# Patient Record
Sex: Female | Born: 1965 | Race: Black or African American | Hispanic: No | Marital: Married | Smoking: Former smoker
Health system: Southern US, Community
[De-identification: ages and names within clinical notes are randomized; demographics above are authoritative.]

## PROBLEM LIST (undated history)

## (undated) DIAGNOSIS — K5909 Other constipation: Secondary | ICD-10-CM

## (undated) DIAGNOSIS — N951 Menopausal and female climacteric states: Secondary | ICD-10-CM

## (undated) DIAGNOSIS — K219 Gastro-esophageal reflux disease without esophagitis: Secondary | ICD-10-CM

## (undated) DIAGNOSIS — R7989 Other specified abnormal findings of blood chemistry: Secondary | ICD-10-CM

## (undated) DIAGNOSIS — R5382 Chronic fatigue, unspecified: Secondary | ICD-10-CM

## (undated) HISTORY — DX: Chronic fatigue, unspecified: R53.82

## (undated) HISTORY — DX: Menopausal and female climacteric states: N95.1

## (undated) HISTORY — DX: Other specified abnormal findings of blood chemistry: R79.89

## (undated) HISTORY — DX: Other constipation: K59.09

## (undated) HISTORY — PX: TUBAL LIGATION: SHX77

---

## 2004-08-02 ENCOUNTER — Emergency Department: Payer: Self-pay | Admitting: Emergency Medicine

## 2005-07-09 ENCOUNTER — Ambulatory Visit: Payer: Self-pay | Admitting: General Practice

## 2005-09-03 ENCOUNTER — Ambulatory Visit: Payer: Self-pay | Admitting: Internal Medicine

## 2011-07-06 ENCOUNTER — Emergency Department: Payer: Self-pay | Admitting: Emergency Medicine

## 2014-09-13 ENCOUNTER — Ambulatory Visit (INDEPENDENT_AMBULATORY_CARE_PROVIDER_SITE_OTHER): Payer: BLUE CROSS/BLUE SHIELD | Admitting: Family Medicine

## 2014-09-13 ENCOUNTER — Encounter: Payer: Self-pay | Admitting: Family Medicine

## 2014-09-13 VITALS — BP 118/70 | HR 87 | Temp 98.1°F | Resp 16 | Ht 63.0 in | Wt 195.4 lb

## 2014-09-13 DIAGNOSIS — D8989 Other specified disorders involving the immune mechanism, not elsewhere classified: Secondary | ICD-10-CM | POA: Insufficient documentation

## 2014-09-13 DIAGNOSIS — R7989 Other specified abnormal findings of blood chemistry: Secondary | ICD-10-CM | POA: Diagnosis not present

## 2014-09-13 DIAGNOSIS — M25562 Pain in left knee: Secondary | ICD-10-CM

## 2014-09-13 DIAGNOSIS — Z13220 Encounter for screening for lipoid disorders: Secondary | ICD-10-CM | POA: Insufficient documentation

## 2014-09-13 DIAGNOSIS — K5900 Constipation, unspecified: Secondary | ICD-10-CM | POA: Diagnosis not present

## 2014-09-13 DIAGNOSIS — Z1239 Encounter for other screening for malignant neoplasm of breast: Secondary | ICD-10-CM | POA: Insufficient documentation

## 2014-09-13 DIAGNOSIS — E669 Obesity, unspecified: Secondary | ICD-10-CM | POA: Diagnosis not present

## 2014-09-13 DIAGNOSIS — Z131 Encounter for screening for diabetes mellitus: Secondary | ICD-10-CM | POA: Insufficient documentation

## 2014-09-13 DIAGNOSIS — K5909 Other constipation: Secondary | ICD-10-CM | POA: Insufficient documentation

## 2014-09-13 DIAGNOSIS — O039 Complete or unspecified spontaneous abortion without complication: Secondary | ICD-10-CM | POA: Insufficient documentation

## 2014-09-13 DIAGNOSIS — E7800 Pure hypercholesterolemia, unspecified: Secondary | ICD-10-CM | POA: Insufficient documentation

## 2014-09-13 DIAGNOSIS — Z0000 Encounter for general adult medical examination without abnormal findings: Secondary | ICD-10-CM | POA: Diagnosis not present

## 2014-09-13 DIAGNOSIS — G9332 Myalgic encephalomyelitis/chronic fatigue syndrome: Secondary | ICD-10-CM | POA: Insufficient documentation

## 2014-09-13 DIAGNOSIS — Z124 Encounter for screening for malignant neoplasm of cervix: Secondary | ICD-10-CM

## 2014-09-13 DIAGNOSIS — Z1231 Encounter for screening mammogram for malignant neoplasm of breast: Secondary | ICD-10-CM | POA: Diagnosis not present

## 2014-09-13 DIAGNOSIS — N951 Menopausal and female climacteric states: Secondary | ICD-10-CM | POA: Insufficient documentation

## 2014-09-13 DIAGNOSIS — R5382 Chronic fatigue, unspecified: Secondary | ICD-10-CM

## 2014-09-13 MED ORDER — MELOXICAM 15 MG PO TABS: 15.0000 mg | ORAL_TABLET | Freq: Every day | ORAL | Status: DC

## 2014-09-13 NOTE — Patient Instructions (Signed)

## 2014-09-13 NOTE — Progress Notes (Signed)
Name: Kathleen Martinez   MRN: [id number]    DOB: 06/29/1965   Date:09/13/2014       Progress Note  Subjective  Chief Complaint  Chief Complaint  Patient presents with  . Annual Exam  . Knee Pain    patient states that she has some left knee pain at times that shoots upward to her hip and lower back. she did have an injury last year with a knot present.    HPI  Patient is here today for a Complete Female Physical Exam:  The patient has complaints of knee pain. Overall feels healthy. Diet is well balanced. In general does not exercise regularly. Sees dentist regularly and addresses vision concerns with ophthalmologist if applicable. In regards to sexual activity the patient is not currently sexually active. Currently is not concerned about exposure to any STDs. Menstrual history is absent for menstration past 3 years. Post menopausal with all female organs in tact. She is still smoking 1/2 PPD of cigarettes and is interested in quitting but having a hard time.  Joint/Muscle Pain: Patient complains of arthralgias for which has been present for a few months. Pain is located in the left knee(s), is described as aching and throbbing, and is intermittent .  Associated symptoms include: none.  The patient has tried nothing for pain relief.  Related to injury:   In the past has had direct trauma to knee without sequelae.  Constipation: Patient complains of constipation.  Stool pattern has been 4 formed stool(s) per week. Onset was several years ago Defecation has been difficult and incomplete. Co-Morbid conditions:obesity and sedentary lifestyle. Symptoms have been stable. Current Health Habits: Eating fiber? no Exercise?no Water intake? Inadequate. Current OTC/RX therapy has been colace  which has been somewhat effective.  Could not afford the senna-docusate Rx previously sent in.     Past Medical History  Diagnosis Date  . Chronic constipation   . Low serum vitamin D   . Menopausal state   . Chronic  fatigue     Past Surgical History  Procedure Laterality Date  . Tubal ligation      Family History  Problem Relation Age of Onset  . Diabetes Mother   . Hypertension Father   . Diabetes Sister   . Diabetes Brother     Social History   Social History  . Marital Status: Married    Spouse Name: N/A  . Number of Children: N/A  . Years of Education: N/A   Occupational History  . Not on file.   Social History Main Topics  . Smoking status: Current Every Day Smoker -- 0.50 packs/day for 21 years    Types: Cigarettes  . Smokeless tobacco: Not on file  . Alcohol Use: 0.0 oz/week    0 Standard drinks or equivalent per week     Comment: occasional  . Drug Use: No  . Sexual Activity: No   Other Topics Concern  . Not on file   Social History Narrative  . No narrative on file     Current outpatient prescriptions:  Marland Kitchen  Vitamin D, Ergocalciferol, (DRISDOL) 50000 UNITS CAPS capsule, Take by mouth., Disp: , Rfl:  .  senna-docusate (SENOKOT-S) 8.6-50 MG per tablet, Take by mouth., Disp: , Rfl:   No Known Allergies  ROS  CONSTITUTIONAL: No significant weight changes, fever, chills, weakness or fatigue.  HEENT:  - Eyes: No visual changes.  - Ears: No auditory changes. No pain.  - Nose: No sneezing, congestion, runny nose. -  Throat: No sore throat. No changes in swallowing. SKIN: No rash or itching.  CARDIOVASCULAR: No chest pain, chest pressure or chest discomfort. No palpitations or edema.  RESPIRATORY: No shortness of breath, cough or sputum.  GASTROINTESTINAL: No anorexia, nausea, vomiting. No changes in bowel habits. No abdominal pain or blood.  GENITOURINARY: No dysuria. No frequency. No discharge.  NEUROLOGICAL: No headache, dizziness, syncope, paralysis, ataxia, numbness or tingling in the extremities. No memory changes. No change in bowel or bladder control.  MUSCULOSKELETAL: Yes joint pain. No muscle pain. HEMATOLOGIC: No anemia, bleeding or bruising.   LYMPHATICS: No enlarged lymph nodes.  PSYCHIATRIC: No change in mood. No change in sleep pattern.  ENDOCRINOLOGIC: No reports of sweating, cold or heat intolerance. No polyuria or polydipsia.   Objective  Filed Vitals:   09/13/14 0851  BP: 118/70  Pulse: 87  Temp: 98.1 F (36.7 C)  TempSrc: Oral  Resp: 16  Height: 5\' 3"  (1.6 m)  Weight: 195 lb 6.4 oz (88.633 kg)  SpO2: 99%   Body mass index is 34.62 kg/(m^2).  Depression screen PHQ 2/9 09/13/2014  Decreased Interest 0  Down, Depressed, Hopeless 0  PHQ - 2 Score 0     Physical Exam  Constitutional: Patient is obese and well-nourished. In no distress.  HEENT:  - Head: Normocephalic and atraumatic.  - Ears: Bilateral TMs gray, no erythema or effusion - Nose: Nasal mucosa moist - Mouth/Throat: Oropharynx is clear and moist. No tonsillar hypertrophy or erythema. No post nasal drainage.  - Eyes: Conjunctivae clear, EOM movements normal. PERRLA. No scleral icterus.  Neck: Normal range of motion. Neck supple. No JVD present. No thyromegaly present.  Cardiovascular: Normal rate, regular rhythm and normal heart sounds.  No murmur heard.  Pulmonary/Chest: Effort normal and breath sounds normal. No respiratory distress. Abdominal: Soft. Bowel sounds are normal, no distension. There is no tenderness. no masses BREAST: Bilateral breast exam normal with no masses, skin changes or nipple discharge FEMALE GENITALIA:  External genitalia normal External urethra normal Vaginal vault normal without discharge or lesions Cervix normal without discharge or lesions Bimanual exam normal without masses RECTAL: no rectal masses or hemorrhoids Musculoskeletal: Normal range of motion bilateral UE and LE, no joint effusions. Peripheral vascular: Bilateral LE no edema.  Left knee healed scar from previous injury. No crepitus, no swelling, no warmth. Normal ROM.  Mild tenderness over patellar tendon. Neurological: CN II-XII grossly intact with no  focal deficits. Alert and oriented to person, place, and time. Coordination, balance, strength, speech and gait are normal.  Skin: Skin is warm and dry. No rash noted. No erythema.  Psychiatric: Patient has a normal mood and affect. Behavior is normal in office today. Judgment and thought content normal in office today.   Assessment & Plan  1. Annual physical exam She will get flu shot at her work as it is free of charge.  2. Obesity, Class I, BMI 30.0-34.9 (see actual BMI) The patient has been counseled on their higher than normal BMI.  They have verbally expressed understanding their increased risk for other diseases.  In efforts to meet a better target BMI goal the patient has been counseled on lifestyle, diet and exercise modification tactics. Start with moderate intensity aerobic exercise (walking, jogging, elliptical, swimming, group or individual sports, hiking) at least a day at least 4 days a week and increase intensity, duration, frequency as tolerated. Diet should include well balance fresh fruits and vegetables avoiding processed foods, carbohydrates and sugars. Drink at  least 8oz 10 glasses a day avoiding sodas, sugary fruit drinks, sweetened tea. Check weight on a reliable scale daily and monitor weight loss progress daily. Consider investing in mobile phone apps that will help keep track of weight loss goals.  - CBC with Differential/Platelet - Comprehensive metabolic panel - TSH  3. Elevated LDL cholesterol level  - Lipid panel - TSH  4. Low serum vitamin D Finished course of high dose Vit D last year.  - CBC with Differential/Platelet - Comprehensive metabolic panel - Vit D  25 hydroxy (rtn osteoporosis monitoring) - TSH  5. Chronic constipation Increase fiber in diet, more water, more exercise.  - CBC with Differential/Platelet - Comprehensive metabolic panel - TSH  6. Left knee pain Likely early arthritis. Instructed patient that if symptoms persist we  can get X-ray otherwise weight loss will help tremendously.   - meloxicam (MOBIC) 15 MG tablet; Take 1 tablet (15 mg total) by mouth daily.  Dispense: 30 tablet; Refill: 2  7. Encounter for screening mammogram for malignant neoplasm of breast  - MM Digital Screening; Future  8. Encounter for screening for malignant neoplasm of cervix  - Pap IG w/ reflex to HPV when [facility]

## 2014-09-15 LAB — PAP IG W/ RFLX HPV ASCU: PAP SMEAR COMMENT: 0

## 2014-12-24 ENCOUNTER — Emergency Department
Admission: EM | Admit: 2014-12-24 | Discharge: 2014-12-24 | Disposition: A | Discharge status: Home / Self Care | Payer: BLUE CROSS/BLUE SHIELD | Attending: Emergency Medicine | Admitting: Emergency Medicine

## 2014-12-24 ENCOUNTER — Encounter: Payer: Self-pay | Admitting: Emergency Medicine

## 2014-12-24 ENCOUNTER — Emergency Department: Payer: BLUE CROSS/BLUE SHIELD

## 2014-12-24 DIAGNOSIS — Y9389 Activity, other specified: Secondary | ICD-10-CM | POA: Insufficient documentation

## 2014-12-24 DIAGNOSIS — Y9289 Other specified places as the place of occurrence of the external cause: Secondary | ICD-10-CM | POA: Insufficient documentation

## 2014-12-24 DIAGNOSIS — Y990 Civilian activity done for income or pay: Secondary | ICD-10-CM | POA: Diagnosis not present

## 2014-12-24 DIAGNOSIS — Z791 Long term (current) use of non-steroidal anti-inflammatories (NSAID): Secondary | ICD-10-CM | POA: Insufficient documentation

## 2014-12-24 DIAGNOSIS — X58XXXA Exposure to other specified factors, initial encounter: Secondary | ICD-10-CM | POA: Insufficient documentation

## 2014-12-24 DIAGNOSIS — F17210 Nicotine dependence, cigarettes, uncomplicated: Secondary | ICD-10-CM | POA: Insufficient documentation

## 2014-12-24 DIAGNOSIS — S4991XA Unspecified injury of right shoulder and upper arm, initial encounter: Secondary | ICD-10-CM | POA: Diagnosis present

## 2014-12-24 DIAGNOSIS — M25511 Pain in right shoulder: Secondary | ICD-10-CM

## 2014-12-24 DIAGNOSIS — Z79899 Other long term (current) drug therapy: Secondary | ICD-10-CM | POA: Insufficient documentation

## 2014-12-24 DIAGNOSIS — S46911A Strain of unspecified muscle, fascia and tendon at shoulder and upper arm level, right arm, initial encounter: Secondary | ICD-10-CM | POA: Diagnosis not present

## 2014-12-24 MED ORDER — NAPROXEN 500 MG PO TABS: 500.0000 mg | ORAL_TABLET | Freq: Two times a day (BID) | ORAL | Status: DC

## 2014-12-24 NOTE — ED Notes (Signed)
States she developed pain to right shoulder after throwing some trash in the dumpster yesterday  Having pain to shoulder with movement ROM is limited d/t pain no deformity noted

## 2014-12-24 NOTE — ED Notes (Signed)
Patient is complaining of right shoulder pain.  Patient reports taking the trash out and throwing a bag into the trash can.  After this pain in her shoulder started.  Patient reports decreased range of motion in her right arm.

## 2014-12-24 NOTE — ED Provider Notes (Signed)
[facility] Emergency Department Provider Note  ____________________________________________  Time seen: Approximately 9:00 AM  I have reviewed the triage vital signs and the nursing notes.   HISTORY  Chief Complaint Shoulder Pain  HPI Kathleen Martinez is a 49 y.o. female is here with complaint of right shoulder pain after throwing some trash in a dumpster yesterday. Patient states this occurred at work and she is unable to estimate how much the trash weight when she did this. She states that she is having difficulty with range of motion of her right shoulder secondary to pain. She is not taking any over-the-counter medication for this. She denies any previous injury to her right shoulder. There is no paresthesias to her right arm.Currently she rates her pain as 5 out of 10. Pain is constant and unrelieved with rest.   Past Medical History  Diagnosis Date  . Chronic constipation   . Low serum vitamin D   . Menopausal state   . Chronic fatigue     Patient Active Problem List   Diagnosis Date Noted  . Chronic constipation 09/13/2014  . Low serum vitamin D 09/13/2014  . Obesity, Class I, BMI 30.0-34.9 (see actual BMI) 09/13/2014  . Elevated LDL cholesterol level 09/13/2014  . Annual physical exam 09/13/2014  . Left knee pain 09/13/2014  . Encounter for screening mammogram for malignant neoplasm of breast 09/13/2014  . Encounter for screening for malignant neoplasm of cervix 09/13/2014    Past Surgical History  Procedure Laterality Date  . Tubal ligation      Current Outpatient Rx  Name  Route  Sig  Dispense  Refill  . meloxicam (MOBIC) 15 MG tablet   Oral   Take 1 tablet (15 mg total) by mouth daily.   30 tablet   2   . naproxen (NAPROSYN) 500 MG tablet   Oral   Take 1 tablet (500 mg total) by mouth 2 (two) times daily with a meal.   30 tablet   0   . senna-docusate (SENOKOT-S) 8.6-50 MG per tablet   Oral   Take by mouth.         . Vitamin  D, Ergocalciferol, (DRISDOL) 50000 UNITS CAPS capsule   Oral   Take by mouth.           Allergies Review of patient's allergies indicates no known allergies.  Family History  Problem Relation Age of Onset  . Diabetes Mother   . Hypertension Father   . Diabetes Sister   . Diabetes Brother     Social History Social History  Substance Use Topics  . Smoking status: Current Every Day Smoker -- 0.50 packs/day for 21 years    Types: Cigarettes  . Smokeless tobacco: None  . Alcohol Use: 0.0 oz/week    0 Standard drinks or equivalent per week     Comment: occasional    Review of Systems Constitutional: No fever/chills Eyes: No visual changes. ENT: No trauma   Cardiovascular: Denies chest pain. Respiratory: Denies shortness of breath. Gastrointestinal:  No nausea, no vomiting.  Musculoskeletal: Negative for back pain. Positive right shoulder pain. Skin: Negative for rash. Neurological: Negative for headaches, focal weakness or numbness.  10-point ROS otherwise negative.  ____________________________________________   PHYSICAL EXAM:  VITAL SIGNS: ED Triage Vitals  Enc Vitals Group     BP 12/24/14 0828 127/76 mmHg     Pulse Rate 12/24/14 0828 71     Resp 12/24/14 0828 18     Temp 12/24/14  0828 98.2 F (36.8 C)     Temp Source 12/24/14 0828 Oral     SpO2 12/24/14 0828 96 %     Weight 12/24/14 0828 165 lb (74.844 kg)     Height 12/24/14 0828 5\' 3"  (1.6 m)     Head Cir --      Peak Flow --      Pain Score 12/24/14 0828 5     Pain Loc --      Pain Edu? --      Excl. in GC? --     Constitutional: Alert and oriented. Well appearing and in no acute distress. Eyes: Conjunctivae are normal. PERRL. EOMI. Head: Atraumatic. Nose: No congestion/rhinnorhea. Neck: No stridor.  No cervical tenderness on palpation. Cardiovascular: Normal rate, regular rhythm. Grossly normal heart sounds.  Good peripheral circulation. Respiratory: Normal respiratory effort.  No  retractions. Lungs CTAB. Musculoskeletal: Right shoulder exam no gross deformity was noted. There is moderate tenderness on palpation of the distal portion of the clavicle and at the of acromioclavicular joint. Range of motion is restricted secondary to discomfort. No crepitus was noted. Grip strength distal is within normal limits. Neurologic:  Normal speech and language. No gross focal neurologic deficits are appreciated. No gait instability. Skin:  Skin is warm, dry and intact. No rash noted. Psychiatric: Mood and affect are normal. Speech and behavior are normal.  ____________________________________________   LABS (all labs ordered are listed, but only abnormal results are displayed)  Labs Reviewed - No data to display   RADIOLOGY  Right shoulder x-ray per radiologist shows slight narrowing of this acromial space and calcification and hypertrophic spur at the insertion of the supraspinatus on the greater tuberosity of the humeral head. I, Tommi Rumpshonda L Prince Couey, personally viewed and evaluated these images (plain radiographs) as part of my medical decision making.  ____________________________________________   PROCEDURES  Procedure(s) performed: None  Critical Care performed: No  ____________________________________________   INITIAL IMPRESSION / ASSESSMENT AND PLAN / ED COURSE  Pertinent labs & imaging results that were available during my care of the patient were reviewed by me and considered in my medical decision making (see chart for details).  Patient was placed on naproxen twice a day with food and follow-up with Dr. Hyacinth MeekerMiller if any continued problems. Patient was given a note to take to work with restrictions in regards to her right shoulder. ____________________________________________   FINAL CLINICAL IMPRESSION(S) / ED DIAGNOSES  Final diagnoses:  Shoulder pain, acute, right  Muscle strain, shoulder region, right, initial encounter      Tommi RumpsRhonda L Janyia Guion,  PA-C 12/24/14 1143  Sharyn CreamerMark Quale, MD 12/24/14 [phone]

## 2015-01-26 ENCOUNTER — Ambulatory Visit: Payer: BLUE CROSS/BLUE SHIELD | Admitting: Family Medicine

## 2015-02-01 ENCOUNTER — Ambulatory Visit (INDEPENDENT_AMBULATORY_CARE_PROVIDER_SITE_OTHER): Payer: BLUE CROSS/BLUE SHIELD | Admitting: Family Medicine

## 2015-02-01 ENCOUNTER — Encounter: Payer: Self-pay | Admitting: Family Medicine

## 2015-02-01 VITALS — BP 120/80 | HR 110 | Temp 98.1°F | Resp 16 | Ht 63.0 in | Wt 200.0 lb

## 2015-02-01 DIAGNOSIS — J209 Acute bronchitis, unspecified: Secondary | ICD-10-CM | POA: Insufficient documentation

## 2015-02-01 MED ORDER — AMOXICILLIN-POT CLAVULANATE 875-125 MG PO TABS: 1.0000 | ORAL_TABLET | Freq: Two times a day (BID) | ORAL | Status: DC

## 2015-02-01 MED ORDER — HYDROCOD POLST-CPM POLST ER 10-8 MG/5ML PO SUER: 5.0000 mL | Freq: Two times a day (BID) | ORAL | Status: DC | PRN

## 2015-02-01 MED ORDER — BENZONATATE 200 MG PO CAPS: 200.0000 mg | ORAL_CAPSULE | Freq: Three times a day (TID) | ORAL | Status: DC | PRN

## 2015-02-01 NOTE — Patient Instructions (Signed)

## 2015-02-01 NOTE — Progress Notes (Signed)
Name: Kathleen Martinez   MRN: [id number]    DOB: November 20, 1965   Date:02/01/2015       Progress Note  Subjective  Chief Complaint  Chief Complaint  Patient presents with  . URI    HPI  Patient is here today with concerns regarding the following symptoms congestion, sneezing, sinus pressure and productive cough that started 3 weeks ago.  Associated with chills, sweats, fatigue and malaise. Not associated with fever. Has tried the following home remedies: Robatussin.    Past Medical History  Diagnosis Date  . Chronic constipation   . Low serum vitamin D   . Menopausal state   . Chronic fatigue     Social History  Substance Use Topics  . Smoking status: Current Every Day Smoker -- 0.50 packs/day for 21 years    Types: Cigarettes  . Smokeless tobacco: Not on file  . Alcohol Use: 0.0 oz/week    0 Standard drinks or equivalent per week     Comment: occasional     Current outpatient prescriptions:  Marland Kitchen  Vitamin D, Ergocalciferol, (DRISDOL) 50000 UNITS CAPS capsule, Take by mouth., Disp: , Rfl:   No Known Allergies  ROS  Positive for fatigue, nasal congestion, sinus pressure, ear fullness, cough as mentioned in HPI, otherwise all systems reviewed and are negative.  Objective  Filed Vitals:   02/01/15 1417  BP: 120/80  Pulse: 110  Temp: 98.1 F (36.7 C)  TempSrc: Oral  Resp: 16  Height:  (1.6 m)  Weight: 200 lb (90.719 kg)  SpO2: 98%   Body mass index is 35.44 kg/(m^2).   Physical Exam  Constitutional: Patient appears well-developed and well-nourished. In no acute distress but does appear to be fatigued from acute illness. HEENT:  - Head: Normocephalic and atraumatic.  - Ears: RIGHT TM bulging with mild erythema and minimal clear exudate, LEFT TM bulging with minimal clear exudate.  - Nose: Nasal mucosa boggy and congested.  - Mouth/Throat: Oropharynx is moist with slight erythema of bilateral tonsils without hypertrophy or exudates. Post nasal drainage present.   - Eyes: Conjunctivae clear, EOM movements normal. PERRLA. No scleral icterus.  Neck: Normal range of motion. Neck supple. No JVD present. No thyromegaly present. No local lymphadenopathy. Cardiovascular: Regular rate, regular rhythm with no murmurs heard.  Pulmonary/Chest: Effort normal and breath sounds clear with right midline lung rhonchi. Musculoskeletal: Normal range of motion bilateral UE and LE, no joint effusions. Skin: Skin is warm and dry. No rash noted. Psychiatric: Patient has a normal mood and affect. Behavior is normal in office today. Judgment and thought content normal in office today.   Assessment & Plan  1. Bronchitis, acute, with bronchospasm  Etiologies include initial allergic rhinitis or viral infection progressing to superimposed bacterial infection. Instructed patient on increasing hydration, nasal saline spray, steam inhalation, NSAID if tolerated and not contraindicated. Due to prolonged duration of symptoms start antibiotic therapy.  - amoxicillin-clavulanate (AUGMENTIN) 875-125 MG tablet; Take 1 tablet by mouth 2 (two) times daily.  Dispense: 20 tablet; Refill: 0 - chlorpheniramine-HYDROcodone (TUSSIONEX PENNKINETIC ER) 10-8 MG/5ML SUER; Take 5 mLs by mouth every 12 (twelve) hours as needed.  Dispense: 115 mL; Refill: 0 - benzonatate (TESSALON) 200 MG capsule; Take 1 capsule (200 mg total) by mouth 3 (three) times daily as needed for cough.  Dispense: 30 capsule; Refill: 0

## 2015-02-09 DIAGNOSIS — M7520 Bicipital tendinitis, unspecified shoulder: Secondary | ICD-10-CM | POA: Insufficient documentation

## 2015-02-09 DIAGNOSIS — M7550 Bursitis of unspecified shoulder: Secondary | ICD-10-CM | POA: Insufficient documentation

## 2015-04-25 ENCOUNTER — Encounter: Payer: Self-pay | Admitting: Emergency Medicine

## 2015-04-25 ENCOUNTER — Emergency Department
Admission: EM | Admit: 2015-04-25 | Discharge: 2015-04-25 | Disposition: A | Discharge status: Home / Self Care | Payer: BLUE CROSS/BLUE SHIELD | Attending: Emergency Medicine | Admitting: Emergency Medicine

## 2015-04-25 DIAGNOSIS — J209 Acute bronchitis, unspecified: Secondary | ICD-10-CM | POA: Insufficient documentation

## 2015-04-25 DIAGNOSIS — E669 Obesity, unspecified: Secondary | ICD-10-CM | POA: Insufficient documentation

## 2015-04-25 DIAGNOSIS — Z6830 Body mass index (BMI) 30.0-30.9, adult: Secondary | ICD-10-CM | POA: Diagnosis not present

## 2015-04-25 DIAGNOSIS — E785 Hyperlipidemia, unspecified: Secondary | ICD-10-CM | POA: Insufficient documentation

## 2015-04-25 DIAGNOSIS — F17210 Nicotine dependence, cigarettes, uncomplicated: Secondary | ICD-10-CM | POA: Insufficient documentation

## 2015-04-25 DIAGNOSIS — T5994XA Toxic effect of unspecified gases, fumes and vapors, undetermined, initial encounter: Secondary | ICD-10-CM | POA: Insufficient documentation

## 2015-04-25 DIAGNOSIS — K5904 Chronic idiopathic constipation: Secondary | ICD-10-CM | POA: Insufficient documentation

## 2015-04-25 DIAGNOSIS — J689 Unspecified respiratory condition due to chemicals, gases, fumes and vapors: Secondary | ICD-10-CM

## 2015-04-25 DIAGNOSIS — T59811A Toxic effect of smoke, accidental (unintentional), initial encounter: Secondary | ICD-10-CM

## 2015-04-25 NOTE — Discharge Instructions (Signed)
Chemical Inhalation Injury A chemical inhalation injury is an internal injury, such as lung damage, that results from breathing in fumes of a chemical or harmful substance (toxic agent). Chemical inhalation injuries most often occur:  During fires, when materials that are burned release chemicals into the environment.  During work accidents, when large quantities of toxic chemicals are spilled at [employer] or industrial sites. Chemical inhalation injuries vary in severity. An injury tends to be more severe:  The more acidic or alkaline the chemical is.  The more concentrated the substance is.  The longer you are exposed to the substance. RISK FACTORS You are at a high risk for a chemical inhalation injury if you:  Are exposed to burning materials.  Work with chemicals, solvents, or cleaners. SIGNS AND SYMPTOMS Symptoms of a chemical inhalation injury may include:  Hoarse voice.  Shortness of breath or trouble breathing.  Chest pain.  Pale or blue skin.  Mucus production.  Cough.  Weakness.  Dizziness or fainting. DIAGNOSIS Most chemical inhalation injuries can be diagnosed with a physical exam and medical history. Tests may be done to check for lung damage. They may include:  A blood oxygen level test.  A chest X-ray.  Pulmonary function tests. There are no tests to identify the specific chemical or substance that caused the injury. TREATMENT  There is no specific treatment for a chemical inhalation injury. Most treatment is directed at improving the ability of the lungs to deliver oxygen to the body. Time is needed for lung tissue to heal. Supportive treatment may include:  Aerosol treatments to decrease swelling in the airways.  Suctioning of the airways to remove excess mucus.  Supplemental oxygen. HOME CARE INSTRUCTIONS  Do not use any tobacco products, including cigarettes, chewing tobacco, or electronic cigarettes. If you need help quitting, ask your  health care provider.  Do not allow yourself to be exposed to any airway irritants, such as cigarette smoke or smoke from a fireplace.  Follow your health care provider's instructions for the use of any inhalers.  Take medicines only as directed by your health care provider.  Keep all follow-up visits as directed by your health care provider. This is important. SEEK MEDICAL CARE IF:  Your symptoms are not improving as your health care provider predicted. SEEK IMMEDIATE MEDICAL CARE IF:  Your symptoms get worse.  You have increasing shortness of breath or wheezing.  Your skin or your lips appear very pale or blue.  You have a persistent cough.  You cough up blood or dark material.  You have chest pain or weakness.  You have a fever.  You faint.   This information is not intended to replace advice given to you by your health care provider. Make sure you discuss any questions you have with your health care provider.   Document Released: 09/03/2013 Document Reviewed: 09/03/2013 Elsevier Interactive Patient Education 2016 [employer].  Smoke Inhalation, Mild Smoke inhalation means that you have breathed in smoke. Exposure to hot smoke from a fire can damage all parts of your airway including your nose, mouth, throat (trachea), and lungs. If you received a burn injury on the outside of your body from a fire, you are also at risk of having a smoke inhalation injury in your airways. SIGNS AND SYMPTOMS The symptoms of smoke inhalation injury are often delayed for up to a day after exposure and usually improve quickly. Symptoms may include:  Sore throat.  Cough, including coughing up black material that looks  burnt (carbonaceous sputum).  Wheezing or abnormal noises when you inhale (stridor).  Chest pain.  Trouble breathing. RISK FACTORS Patients with chronic lung disease or a history of alcohol abuse are at higher risk for serious complications from smoke  inhalation. DIAGNOSIS Your health care provider may suspect smoke inhalation injury based on the history of exposure, symptoms, and physical findings. Your health care provider may perform other tests such as:  Chest X-ray exams or CT scans.  Inspection of your airway (laryngoscopy or bronchoscopy).  Blood tests. Further medical evaluation and hospital care may be needed if your symptoms get worse over the next 1-2 days. TREATMENT If you have breathing difficulty from the smoke inhalation, you may be admitted to the hospital for overnight observation. If severe breathing trouble develops, a breathing tube may be needed to help you breathe.You also may be treated with supplemental oxygen therapy. HOME CARE INSTRUCTIONS  Do not return to the area of the fire until the proper authorities tell you it is safe.  Do not smoke.  Do not drink alcohol until approved by your health care provider.  Drink enough water and fluids to keep your urine clear or pale yellow.  Get plenty of rest for the next 2-3 days.  Only take over-the-counter or prescription medicines for pain, fever, or discomfort as directed by your health care provider.  Follow up with your health care provider as directed. SEEK IMMEDIATE MEDICAL CARE IF:   You have wheezing, difficulty breathing, a continuous cough, or increased spit.  You have severe chest pain or headache.  You have nausea or vomiting.  You have shortness of breath with your usual activities. Your heart seems to beat too fast with minimal exercise.  You become confused, irritable, or unusually sleepy.  You experience dizziness.  You develop any breathing problems that are worsening rather than improving.   This information is not intended to replace advice given to you by your health care provider. Make sure you discuss any questions you have with your health care provider.   Document Released: 12/29/1999 Document Revised: 10/21/2012 Document  Reviewed: 08/04/2012 Elsevier Interactive Patient Education [employer]2016 [employer] Inc[employer]
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
## 2015-09-15 ENCOUNTER — Encounter: Payer: BLUE CROSS/BLUE SHIELD | Admitting: Family Medicine

## 2016-09-02 ENCOUNTER — Emergency Department: Payer: BLUE CROSS/BLUE SHIELD

## 2016-09-02 ENCOUNTER — Emergency Department
Admission: EM | Admit: 2016-09-02 | Discharge: 2016-09-02 | Disposition: A | Discharge status: Home / Self Care | Payer: BLUE CROSS/BLUE SHIELD | Attending: Emergency Medicine | Admitting: Emergency Medicine

## 2016-09-02 DIAGNOSIS — F17210 Nicotine dependence, cigarettes, uncomplicated: Secondary | ICD-10-CM | POA: Diagnosis not present

## 2016-09-02 DIAGNOSIS — R0602 Shortness of breath: Secondary | ICD-10-CM | POA: Insufficient documentation

## 2016-09-02 DIAGNOSIS — R071 Chest pain on breathing: Secondary | ICD-10-CM | POA: Insufficient documentation

## 2016-09-02 DIAGNOSIS — Z79899 Other long term (current) drug therapy: Secondary | ICD-10-CM | POA: Insufficient documentation

## 2016-09-02 DIAGNOSIS — R911 Solitary pulmonary nodule: Secondary | ICD-10-CM

## 2016-09-02 DIAGNOSIS — R079 Chest pain, unspecified: Secondary | ICD-10-CM

## 2016-09-02 LAB — TROPONIN I
Troponin I: <0.03 ng/mL (ref <0.03)
Troponin I: <0.03 ng/mL (ref <0.03)

## 2016-09-02 LAB — BASIC METABOLIC PANEL
Anion gap: 6 (ref 5–15)
BUN: 16 mg/dL (ref 6–20)
CO2: 27 mmol/L (ref 22–32)
Calcium: 9 mg/dL (ref 8.9–10.3)
Chloride: 107 mmol/L (ref 101–111)
Creatinine, Ser: 1.07 mg/dL — ABNORMAL HIGH (ref 0.44–1.00)
GFR calc Af Amer: >60 mL/min (ref >60)
GFR calc non Af Amer: 59 mL/min — ABNORMAL LOW (ref >60)
GLUCOSE: 96 mg/dL (ref 65–99)
POTASSIUM: 3.7 mmol/L (ref 3.5–5.1)
Sodium: 140 mmol/L (ref 135–145)

## 2016-09-02 LAB — CBC
HCT: 38.2 % (ref 35.0–47.0)
Hemoglobin: 13 g/dL (ref 12.0–16.0)
MCH: 27.3 pg (ref 26.0–34.0)
MCHC: 34.1 g/dL (ref 32.0–36.0)
MCV: 80.2 fL (ref 80.0–100.0)
Platelets: 223 10*3/uL (ref 150–440)
RBC: 4.77 MIL/uL (ref 3.80–5.20)
RDW: 16.7 % — ABNORMAL HIGH (ref 11.5–14.5)
WBC: 7.4 10*3/uL (ref 3.6–11.0)

## 2016-09-02 LAB — FIBRIN DERIVATIVES D-DIMER (ARMC ONLY): Fibrin derivatives D-dimer (ARMC): 1672.85 — ABNORMAL HIGH (ref 0.00–499.00)

## 2016-09-02 MED ORDER — IOPAMIDOL (ISOVUE-370) INJECTION 76%
75.0000 mL | Freq: Once | INTRAVENOUS | Status: AC | PRN
  Administered 2016-09-02: 75 mL via INTRAVENOUS

## 2016-09-02 MED ORDER — IPRATROPIUM-ALBUTEROL 0.5-2.5 (3) MG/3ML IN SOLN
3.0000 mL | Freq: Once | RESPIRATORY_TRACT | Status: AC
  Administered 2016-09-02: 3 mL via RESPIRATORY_TRACT
  Filled 2016-09-02: qty 3

## 2016-09-02 MED ORDER — KETOROLAC TROMETHAMINE 60 MG/2ML IM SOLN
INTRAMUSCULAR | Status: AC
  Administered 2016-09-02: 30 mg
  Filled 2016-09-02: qty 2

## 2016-09-02 MED ORDER — METHYLPREDNISOLONE SODIUM SUCC 125 MG IJ SOLR
125.0000 mg | Freq: Once | INTRAMUSCULAR | Status: AC
  Administered 2016-09-02: 125 mg via INTRAVENOUS
  Filled 2016-09-02: qty 2

## 2016-09-02 MED ORDER — KETOROLAC TROMETHAMINE 30 MG/ML IJ SOLN: 30.0000 mg | Freq: Once | INTRAMUSCULAR | Status: DC

## 2016-09-02 MED ORDER — IBUPROFEN 600 MG PO TABS: 600.0000 mg | ORAL_TABLET | Freq: Three times a day (TID) | ORAL | 0 refills | Status: DC | PRN

## 2016-09-02 NOTE — ED Triage Notes (Signed)
Patient reports right sided non radiating chest pain off/on for 2 weeks, worse tonight.  Patient reports started with sinus congestion.

## 2016-09-02 NOTE — Discharge Instructions (Signed)
Fortunately today your blood work was reassuring and your CT scan did not show a blood clot. It did show a nodule in your lung that needs to be reevaluated by your primary care physician. It will require another CT scan in 3 months to make sure that everything is okay. Please return to the emergency department for any concerns such as fevers, chills, worsening pain, or for any other issues whatsoever.  It was a pleasure to take care of you today, and thank you for coming to our emergency department.  If you have any questions or concerns before leaving please ask the nurse to grab me and I'm more than happy to go through your aftercare instructions again.  If you were prescribed any opioid pain medication today such as Norco, Vicodin, Percocet, morphine, hydrocodone, or oxycodone please make sure you do not drive when you are taking this medication as it can alter your ability to drive safely.  If you have any concerns once you are home that you are not improving or are in fact getting worse before you can make it to your follow-up appointment, please do not hesitate to call 911 and come back for further evaluation.  Merrily Brittle, MD  Results for orders placed or performed during the hospital encounter of 09/02/16  Basic metabolic panel  Result Value Ref Range   Sodium 140 135 - 145 mmol/L   Potassium 3.7 3.5 - 5.1 mmol/L   Chloride 107 101 - 111 mmol/L   CO2 27 22 - 32 mmol/L   Glucose, Bld 96 65 - 99 mg/dL   BUN 16 6 - 20 mg/dL   Creatinine, Ser 1.61 (H) 0.44 - 1.00 mg/dL   Calcium 9.0 8.9 - 09.6 mg/dL   GFR calc non Af Amer 59 (L) >60 mL/min   GFR calc Af Amer >60 >60 mL/min   Anion gap 6 5 - 15  CBC  Result Value Ref Range   WBC 7.4 3.6 - 11.0 K/uL   RBC 4.77 3.80 - 5.20 MIL/uL   Hemoglobin 13.0 12.0 - 16.0 g/dL   HCT 04.5 40.9 - 81.1 %   MCV 80.2 80.0 - 100.0 fL   MCH 27.3 26.0 - 34.0 pg   MCHC 34.1 32.0 - 36.0 g/dL   RDW 91.4 (H) 78.2 - 95.6 %   Platelets 223 150 - 440 K/uL    Troponin I  Result Value Ref Range   Troponin I <0.03 <0.03 ng/mL  Troponin I  Result Value Ref Range   Troponin I <0.03 <0.03 ng/mL  Fibrin derivatives D-Dimer ([facility] only)  Result Value Ref Range   Fibrin derivatives D-dimer ([facility]) 1,672.85 (H) 0.00 - 499.00   Dg Chest 2 View  Result Date: 09/02/2016 CLINICAL DATA:  Intermittent RIGHT chest pain for 2 weeks. Sinus congestion. EXAM: CHEST  2 VIEW COMPARISON:  None. FINDINGS: Cardiomediastinal silhouette is normal. Trace RIGHT pleural effusion. No focal consolidation. No pneumothorax. Soft tissue planes and included osseous structures are normal. IMPRESSION: Trace RIGHT pleural effusion. Electronically Signed   By: Awilda Metro M.D.   On: 09/02/2016 02:52   Ct Angio Chest Pe W And/or Wo Contrast  Result Date: 09/02/2016 CLINICAL DATA:  Short of breath and intermittent chest pain for 2 weeks EXAM: CT ANGIOGRAPHY CHEST WITH CONTRAST TECHNIQUE: Multidetector CT imaging of the chest was performed using the standard protocol during bolus administration of intravenous contrast. Multiplanar CT image reconstructions and MIPs were obtained to evaluate the vascular anatomy. CONTRAST:  75  cc Isovue 370 COMPARISON:  None. FINDINGS: Cardiovascular: There are no filling defects in the pulmonary arterial tree to suggest acute pulmonary thromboembolism. Mild atherosclerotic calcification of the aortic arch and origin of the left subclavian artery. Great vessels are grossly patent within the confines of the examination including the bilateral vertebral arteries. No evidence of aortic dissection or intramural hematoma. Mediastinum/Nodes: Small bilateral hilar and mediastinal nodes are present. 8 mm short axis diameter precarinal node on image 36. Small bilateral hilar nodes on image 47. No pericardial effusion. Thyroid is unremarkable. Esophagus is unremarkable. Lungs/Pleura: Tiny bilateral pleural effusions. No pneumothorax. Dependent atelectasis. There are at  least 2 focal patchy areas of ground-glass in the right upper lobe. On image 43, the more anterior larger entity measures 2.5 cm. There is a indeterminate density in the right upper lobe containing solid and sub solid elements. The solid component measures 5 mm on image 35. Patchy peripheral emphysema in both upper lobes right greater than left. Upper Abdomen: No acute abnormality. Musculoskeletal: No vertebral compression deformity. No evidence of acute rib fracture. Review of the MIP images confirms the above findings. IMPRESSION: No evidence of acute pulmonary embolism or acute vascular pathology. Small mediastinal and hilar nodes are likely related to volume overload worn inflammatory process. 2.5 cm ground-glass opacity in the right upper lobe. Initial follow-up by chest CT without contrast is recommended in 3 months to confirm persistence. This recommendation follows the consensus statement: Recommendations for the Management of Subsolid Pulmonary Nodules Detected at CT: A Statement from the Fleischner Society as published in Radiology 2013; 266:304-317. Solid and sub solid nodule in the right upper lobe. The solid component measures 5 mm. Initial follow-up by chest CT without contrast is recommended in 3 months to confirm persistence. This recommendation follows the consensus statement: Recommendations for the Management of Subsolid Pulmonary Nodules Detected at CT: A Statement from the Fleischner Society as published in Radiology 2013; 266:304-317. Aortic Atherosclerosis (ICD10-I70.0) and Emphysema (ICD10-J43.9). Electronically Signed   By: Jolaine Click M.D.   On: 09/02/2016 08:31

## 2016-09-02 NOTE — ED Provider Notes (Signed)
[facility] Emergency Department Provider Note   ____________________________________________   First MD Initiated Contact with Patient 09/02/16 [phone]     (approximate)  I have reviewed the triage vital signs and the nursing notes.   HISTORY  Chief Complaint Chest Pain    HPI Kathleen Martinez is a 51 y.o. female who comes into the hospital today with some chest pain. The patient states that she had it for 2 weeks but it really wasn't that bad initially. She reports it is been getting worse over the last day or 2. She reports that it hurts in her right chest and it's worse whenever she tries to take a deep breath or whenever she moves. The patient has not taken anything for pain at home. Most of the time she said it would just go away when she got up and started walking but not this time. The patient denies nausea and vomiting but endorses some shortness of breath. The patient denies any fever or recent travel. She has pain as a 9 out of 10 in intensity. It sharp. The patient does also have a cough. She states that the pain is worse when she coughs and her cough is occasionally productive of yellow phlegm. The patient states that the pain seems to score on her right breast but doesn't radiate anywhere else. The patient denies any lightheadedness or dizziness. She is here today for evaluation of her symptoms.   Past Medical History:  Diagnosis Date  . Chronic constipation   . Chronic fatigue   . Low serum vitamin D   . Menopausal state     Patient Active Problem List   Diagnosis Date Noted  . Bronchitis, acute, with bronchospasm 02/01/2015  . Chronic constipation 09/13/2014  . Low serum vitamin D 09/13/2014  . Obesity, Class I, BMI 30.0-34.9 (see actual BMI) 09/13/2014  . Elevated LDL cholesterol level 09/13/2014  . Left knee pain 09/13/2014    Past Surgical History:  Procedure Laterality Date  . TUBAL LIGATION      Prior to Admission medications     Medication Sig Start Date End Date Taking? Authorizing Provider  amoxicillin-clavulanate (AUGMENTIN) 875-125 MG tablet Take 1 tablet by mouth 2 (two) times daily. 02/01/15   Edwena Felty, MD  benzonatate (TESSALON) 200 MG capsule Take 1 capsule (200 mg total) by mouth 3 (three) times daily as needed for cough. 02/01/15   Edwena Felty, MD  chlorpheniramine-HYDROcodone (TUSSIONEX PENNKINETIC ER) 10-8 MG/5ML SUER Take 5 mLs by mouth every 12 (twelve) hours as needed. 02/01/15   Edwena Felty, MD  Vitamin D, Ergocalciferol, (DRISDOL) 50000 UNITS CAPS capsule Take by mouth. 08/03/13   [provider]    Allergies Patient has no known allergies.  Family History  Problem Relation Age of Onset  . Diabetes Mother   . Hypertension Father   . Diabetes Sister   . Diabetes Brother     Social History Social History  Substance Use Topics  . Smoking status: Current Every Day Smoker    Packs/day: 0.50    Years: 21.00    Types: Cigarettes  . Smokeless tobacco: Not on file  . Alcohol use 0.0 oz/week     Comment: occasional    Review of Systems  Constitutional: No fever/chills Eyes: No visual changes. ENT: No sore throat. Cardiovascular:  chest pain. Respiratory: cough and shortness of breath. Gastrointestinal: No abdominal pain.  No nausea, no vomiting.  No diarrhea.  No constipation. Genitourinary: Negative for dysuria. Musculoskeletal: Negative  for back pain. Skin: Negative for rash. Neurological: Negative for headaches, focal weakness or numbness.   ____________________________________________   PHYSICAL EXAM:  VITAL SIGNS: ED Triage Vitals  Enc Vitals Group     BP 09/02/16 0219 124/74     Pulse Rate 09/02/16 0219 70     Resp 09/02/16 0219 16     Temp 09/02/16 0219 98 F (36.7 C)     Temp Source 09/02/16 0219 Oral     SpO2 09/02/16 0219 99 %     Weight 09/02/16 0219 180 lb (81.6 kg)     Height 09/02/16 0219 5\' 3"  (1.6 m)     Head Circumference --       Peak Flow --      Pain Score 09/02/16 0222 8     Pain Loc --      Pain Edu? --      Excl. in GC? --     Constitutional: Alert and oriented. Well appearing and in moderate distress. Eyes: Conjunctivae are normal. PERRL. EOMI. Head: Atraumatic. Nose: No congestion/rhinnorhea. Mouth/Throat: Mucous membranes are moist.  Oropharynx non-erythematous. Cardiovascular: Normal rate, regular rhythm. Grossly normal heart sounds.  Good peripheral circulation. Respiratory: Normal respiratory effort.  No retractions. Crackles in right base. Gastrointestinal: Soft and nontender. No distention. Positive bowel sounds Musculoskeletal: No lower extremity tenderness nor edema.  Neurologic:  Normal speech and language.  Skin:  Skin is warm, dry and intact.  Psychiatric: Mood and affect are normal.   ____________________________________________   LABS (all labs ordered are listed, but only abnormal results are displayed)  Labs Reviewed  BASIC METABOLIC PANEL - Abnormal; Notable for the following:       Result Value   Creatinine, Ser 1.07 (*)    GFR calc non Af Amer 59 (*)    All other components within normal limits  CBC - Abnormal; Notable for the following:    RDW 16.7 (*)    All other components within normal limits  FIBRIN DERIVATIVES D-DIMER ([facility] ONLY) - Abnormal; Notable for the following:    Fibrin derivatives D-dimer [facility]) 2,330.07 (*)    All other components within normal limits  TROPONIN I  TROPONIN I   ____________________________________________  EKG  ED ECG REPORT I, Rebecka Apley, the attending physician, personally viewed and interpreted this ECG.   Date: 09/02/2016  EKG Time: 217  Rate: 76  Rhythm: normal sinus rhythm  Axis: normal  Intervals:none  ST&T Change: diffuse t wave flattening  ____________________________________________  RADIOLOGY  Dg Chest 2 View  Result Date: 09/02/2016 CLINICAL DATA:  Intermittent RIGHT chest pain for 2 weeks. Sinus  congestion. EXAM: CHEST  2 VIEW COMPARISON:  None. FINDINGS: Cardiomediastinal silhouette is normal. Trace RIGHT pleural effusion. No focal consolidation. No pneumothorax. Soft tissue planes and included osseous structures are normal. IMPRESSION: Trace RIGHT pleural effusion. Electronically Signed   By: Awilda Metro M.D.   On: 09/02/2016 02:52    ____________________________________________   PROCEDURES  Procedure(s) performed: None  Procedures  Critical Care performed: No  ____________________________________________   INITIAL IMPRESSION / ASSESSMENT AND PLAN / ED COURSE  Pertinent labs & imaging results that were available during my care of the patient were reviewed by me and considered in my medical decision making (see chart for details).  This is a 51 year old female who comes into the hospital today with some chest pain. The patient reports it is worse whenever she takes a deep breath but also hurts when she touches her chest.  THE patient a shot of Toradol and check a repeat troponin and a d-dimer. The patient's chest x-ray showed a trace right pleural effusion and she did have some crackles on that side. I will also give the patient a DuoNeb treatment and a shot of Solu-Medrol. I will reassess the patient once I received her d-dimer.    The patient's d-dimer returned positive at 1672.85. I will order a CT angios of the patient's chest for further evaluation for pulmonary embolus.  ____________________________________________   FINAL CLINICAL IMPRESSION(S) / ED DIAGNOSES  Final diagnoses:  Chest pain, unspecified type  Shortness of breath      NEW MEDICATIONS STARTED DURING THIS VISIT:  New Prescriptions   No medications on file     Note:  This document was prepared using Dragon voice recognition software and may include unintentional dictation errors.    Rebecka Apley, MD 09/02/16 [phone]

## 2016-09-02 NOTE — ED Notes (Signed)
Pt reports right sided chest pain for 2 weeks, sharp most of the time; pain increases with deep inspiration; pt noted to be holding breath at times, says due to pain; pt encouraged to breathe normally for oxygenation; pt says pain is up under right ribcage area and does not radiate; reports cough that is intermittently productive of yellow sputum; short of breath at rest and slightly increased with exertion; pain worse when laying down to sleep at night; pt says pain actually improves when she's up ambulating; denies fever; denies N/V; talking in complete coherent sentences

## 2016-09-02 NOTE — ED Notes (Signed)
At bedside with Dr Zenda Alpers; pt placed on cardiac monitor

## 2016-09-02 NOTE — ED Provider Notes (Signed)
Care signed over from Dr. Zenda Alpers pending results of CT angiogram. CT scan is negative for pulmonary embolism or aortic dissection. It does show a pulmonary nodule and as the patient is a current cigarette smoker I discussed the possibility of early malignancy and the importance of early follow-up. She understands she requires another CT scan in 3 months. She is discharged home in good condition.   Merrily Brittle, MD 09/02/16 [phone]

## 2017-03-03 ENCOUNTER — Encounter: Payer: Self-pay | Admitting: Intensive Care

## 2017-03-03 ENCOUNTER — Emergency Department: Payer: BLUE CROSS/BLUE SHIELD

## 2017-03-03 ENCOUNTER — Emergency Department
Admission: EM | Admit: 2017-03-03 | Discharge: 2017-03-03 | Disposition: A | Discharge status: Home / Self Care | Payer: BLUE CROSS/BLUE SHIELD | Attending: Emergency Medicine | Admitting: Emergency Medicine

## 2017-03-03 DIAGNOSIS — J209 Acute bronchitis, unspecified: Secondary | ICD-10-CM | POA: Insufficient documentation

## 2017-03-03 DIAGNOSIS — J111 Influenza due to unidentified influenza virus with other respiratory manifestations: Secondary | ICD-10-CM | POA: Insufficient documentation

## 2017-03-03 DIAGNOSIS — Z79899 Other long term (current) drug therapy: Secondary | ICD-10-CM | POA: Insufficient documentation

## 2017-03-03 DIAGNOSIS — R05 Cough: Secondary | ICD-10-CM | POA: Diagnosis present

## 2017-03-03 DIAGNOSIS — F17210 Nicotine dependence, cigarettes, uncomplicated: Secondary | ICD-10-CM | POA: Insufficient documentation

## 2017-03-03 DIAGNOSIS — J208 Acute bronchitis due to other specified organisms: Secondary | ICD-10-CM

## 2017-03-03 DIAGNOSIS — J101 Influenza due to other identified influenza virus with other respiratory manifestations: Secondary | ICD-10-CM

## 2017-03-03 LAB — INFLUENZA PANEL BY PCR (TYPE A & B)
INFLAPCR: POSITIVE — AB
INFLBPCR: NEGATIVE

## 2017-03-03 MED ORDER — HYDROCOD POLST-CPM POLST ER 10-8 MG/5ML PO SUER: 5.0000 mL | Freq: Two times a day (BID) | ORAL | 0 refills | Status: DC | PRN

## 2017-03-03 MED ORDER — OSELTAMIVIR PHOSPHATE 75 MG PO CAPS: 75.0000 mg | ORAL_CAPSULE | Freq: Two times a day (BID) | ORAL | 0 refills | Status: DC

## 2017-03-03 NOTE — Discharge Instructions (Signed)
Follow-up with your regular doctor or the acute care if you are not improving in 3-5 days.  Use medication as prescribed.  You have influenza A, you should not return to work until you have not had a fever for 24-48 hours.  Drink plenty of fluids.  Take Tylenol and ibuprofen as needed for fever.  He can continue to take the over-the-counter cough medicines.  I have also given you a prescription for Tussionex cough syrup.  This medication has a narcotic in it and will make you drowsy.  Do not drive a vehicle or work any heavy machinery while taking this medication.

## 2017-03-03 NOTE — ED Triage Notes (Signed)
Patient c/o productive cough with yellow tint, chills, and body aches that started X2 days ago

## 2017-03-03 NOTE — ED Provider Notes (Signed)
[facility] Emergency Department Provider Note  ____________________________________________   First MD Initiated Contact with Patient 03/03/17 1748     (approximate)  I have reviewed the triage vital signs and the nursing notes.   HISTORY  Chief Complaint Cough and Generalized Body Aches    HPI Kathleen Martinez is a 52 y.o. female resents to the emergency department today with body aches, chills, and cough for about 2 days.  She states that she has been taking a medication that has fever reducer in it.  On the boxes states Mucinex with fever reducer.  She is also been using Flonase.  She states cough is very congestion and deep in her chest.  She is denies any vomiting or diarrhea.  She denies any chest pain or shortness of breath.  Past Medical History:  Diagnosis Date  . Chronic constipation   . Chronic fatigue   . Low serum vitamin D   . Menopausal state     Patient Active Problem List   Diagnosis Date Noted  . Bronchitis, acute, with bronchospasm 02/01/2015  . Chronic constipation 09/13/2014  . Low serum vitamin D 09/13/2014  . Obesity, Class I, BMI 30.0-34.9 (see actual BMI) 09/13/2014  . Elevated LDL cholesterol level 09/13/2014  . Left knee pain 09/13/2014    Past Surgical History:  Procedure Laterality Date  . TUBAL LIGATION      Prior to Admission medications   Medication Sig Start Date End Date Taking? Authorizing Provider  chlorpheniramine-HYDROcodone (TUSSIONEX PENNKINETIC ER) 10-8 MG/5ML SUER Take 5 mLs by mouth every 12 (twelve) hours as needed for cough. 03/03/17   Sherrie MustacheFisher, Roselyn BeringSusan W, PA-C  cholecalciferol (VITAMIN D) 1000 units tablet Take 1,000 Units by mouth daily.    [provider]  fluticasone (FLONASE) 50 MCG/ACT nasal spray Place 1 spray into both nostrils daily as needed for allergies or rhinitis.    [provider]  ibuprofen (ADVIL,MOTRIN) 600 MG tablet Take 1 tablet (600 mg total) by mouth every 8 (eight)  hours as needed. 09/02/16   Merrily Brittleifenbark, Neil, MD  oseltamivir (TAMIFLU) 75 MG capsule Take 1 capsule (75 mg total) by mouth 2 (two) times daily. 03/03/17   Faythe GheeFisher, Susan W, PA-C    Allergies Patient has no known allergies.  Family History  Problem Relation Age of Onset  . Diabetes Mother   . Hypertension Father   . Diabetes Sister   . Diabetes Brother     Social History Social History   Tobacco Use  . Smoking status: Current Every Day Smoker    Packs/day: 0.50    Years: 21.00    Pack years: 10.50    Types: Cigarettes  . Smokeless tobacco: Never Used  Substance Use Topics  . Alcohol use: Yes    Alcohol/week: 0.0 oz    Comment: occasional  . Drug use: No    Review of Systems  Constitutional: No fever/chills Eyes: No visual changes. ENT: No sore throat. Respiratory: Positive cough Gastrointestinal: Denies vomiting or diarrhea Genitourinary: Negative for dysuria. Musculoskeletal: Negative for back pain. Skin: Negative for rash.    ____________________________________________   PHYSICAL EXAM:  VITAL SIGNS: ED Triage Vitals [03/03/17 1718]  Enc Vitals Group     BP 112/74     Pulse Rate 80     Resp 16     Temp 98.9 F (37.2 C)     Temp Source Oral     SpO2 97 %     Weight 197 lb (89.4 kg)  Height 5\' 2"  (1.575 m)     Head Circumference      Peak Flow      Pain Score      Pain Loc      Pain Edu?      Excl. in GC?     Constitutional: Alert and oriented. Well appearing and in no acute distress. Eyes: Conjunctivae are normal.  Head: Atraumatic. Nose: No congestion/rhinnorhea. Mouth/Throat: Mucous membranes are moist.  Throat is normal Cardiovascular: Normal rate, regular rhythm.  Heart sounds are normal Respiratory: Normal respiratory effort.  No retractions, lungs are clear to auscultation, however the cough is very deep wet and congested GU: deferred Musculoskeletal: FROM all extremities, warm and well perfused Neurologic:  Normal speech and  language.  Skin:  Skin is warm, dry and intact. No rash noted. Psychiatric: Mood and affect are normal. Speech and behavior are normal.  ____________________________________________   LABS (all labs ordered are listed, but only abnormal results are displayed)  Labs Reviewed  INFLUENZA PANEL BY PCR (TYPE A & B) - Abnormal; Notable for the following components:      Result Value   Influenza A By PCR POSITIVE (*)    All other components within normal limits   ____________________________________________   ____________________________________________  RADIOLOGY  Chest x-ray is negative for pneumonia, has a bronchitis type pattern  ____________________________________________   PROCEDURES  Procedure(s) performed: No  Procedures    ____________________________________________   INITIAL IMPRESSION / ASSESSMENT AND PLAN / ED COURSE  Pertinent labs & imaging results that were available during my care of the patient were reviewed by me and considered in my medical decision making (see chart for details).  Patient is a 51 year old female presenting to the emergency department with complaints of cough and congestion with fever, chills and body aches.  On physical exam patient appears well, lungs clear to auscultation however the cough is deep and wet  Chest x-ray and flu tests are ordered  Chest x-ray is negative for pneumonia    ----------------------------------------- 7:02 PM on 03/03/2017 -----------------------------------------  Influenza test is positive for a, patient was given the test results.  She was informed that she does not have pneumonia.  She was given a prescription for Tamiflu 75 mg twice daily and Tussionex 5 mL's every 12 hours as needed cough 140 mL's no refill.  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  She understands that she is contagious and should not work.  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  She is to drink plenty of fluids.  She is to rest.  If she is worsening she is to return to the emergency department.  Patient states she understands comply with our recommendations.  She was discharged in stable condition  As part of my medical decision making, I reviewed the following data within the electronic MEDICAL RECORD NUMBER Nursing notes reviewed and incorporated, Labs reviewed influenza was positive for a, Radiograph reviewed chest x-rays negative for pneumonia, Notes from prior ED visits and [place] Controlled Substance Database  ____________________________________________   FINAL CLINICAL IMPRESSION(S) / ED DIAGNOSES  Final diagnoses:  Influenza A  Acute viral bronchitis      NEW MEDICATIONS STARTED DURING THIS VISIT:  New Prescriptions   CHLORPHENIRAMINE-HYDROCODONE (TUSSIONEX PENNKINETIC ER) 10-8 MG/5ML SUER    Take 5 mLs by mouth every 12 (twelve) hours as needed for cough.  OSELTAMIVIR (TAMIFLU) 75 MG CAPSULE    Take 1 capsule (75 mg total) by mouth 2 (two) times daily.     Note:  This document was prepared using Dragon voice recognition software and may include unintentional dictation errors.    Faythe Ghee, PA-C 03/03/17 Julian Reil    Minna Antis, MD 03/03/17 [phone]

## 2017-03-03 NOTE — ED Notes (Signed)
See triage note  States she developed body aches ,chills and cough abotu 2 days ago  Afebrile on arrival

## 2017-03-27 ENCOUNTER — Emergency Department: Payer: BLUE CROSS/BLUE SHIELD

## 2017-03-27 ENCOUNTER — Encounter: Payer: Self-pay | Admitting: Emergency Medicine

## 2017-03-27 ENCOUNTER — Emergency Department
Admission: EM | Admit: 2017-03-27 | Discharge: 2017-03-27 | Disposition: A | Discharge status: Home / Self Care | Payer: BLUE CROSS/BLUE SHIELD | Attending: Emergency Medicine | Admitting: Emergency Medicine

## 2017-03-27 ENCOUNTER — Other Ambulatory Visit: Payer: Self-pay

## 2017-03-27 DIAGNOSIS — N390 Urinary tract infection, site not specified: Secondary | ICD-10-CM

## 2017-03-27 DIAGNOSIS — F17210 Nicotine dependence, cigarettes, uncomplicated: Secondary | ICD-10-CM | POA: Insufficient documentation

## 2017-03-27 DIAGNOSIS — Z79899 Other long term (current) drug therapy: Secondary | ICD-10-CM | POA: Diagnosis not present

## 2017-03-27 DIAGNOSIS — R319 Hematuria, unspecified: Secondary | ICD-10-CM | POA: Diagnosis not present

## 2017-03-27 DIAGNOSIS — R079 Chest pain, unspecified: Secondary | ICD-10-CM

## 2017-03-27 DIAGNOSIS — R0789 Other chest pain: Secondary | ICD-10-CM | POA: Diagnosis not present

## 2017-03-27 LAB — URINALYSIS, COMPLETE (UACMP) WITH MICROSCOPIC
BILIRUBIN URINE: NEGATIVE
Glucose, UA: NEGATIVE mg/dL
Ketones, ur: NEGATIVE mg/dL
NITRITE: NEGATIVE
Protein, ur: 100 mg/dL — AB
SPECIFIC GRAVITY, URINE: 1.008 (ref 1.005–1.030)
Squamous Epithelial / LPF: NONE SEEN
pH: 6 (ref 5.0–8.0)

## 2017-03-27 LAB — COMPREHENSIVE METABOLIC PANEL
ALT: 13 U/L — ABNORMAL LOW (ref 14–54)
AST: 17 U/L (ref 15–41)
Albumin: 4 g/dL (ref 3.5–5.0)
Alkaline Phosphatase: 60 U/L (ref 38–126)
Anion gap: 9 (ref 5–15)
BUN: 17 mg/dL (ref 6–20)
CHLORIDE: 105 mmol/L (ref 101–111)
CO2: 26 mmol/L (ref 22–32)
Calcium: 9.1 mg/dL (ref 8.9–10.3)
Creatinine, Ser: 1.24 mg/dL — ABNORMAL HIGH (ref 0.44–1.00)
GFR calc non Af Amer: 49 mL/min — ABNORMAL LOW (ref >60)
GFR, EST AFRICAN AMERICAN: 57 mL/min — AB (ref >60)
Glucose, Bld: 109 mg/dL — ABNORMAL HIGH (ref 65–99)
POTASSIUM: 3.6 mmol/L (ref 3.5–5.1)
SODIUM: 140 mmol/L (ref 135–145)
Total Bilirubin: 0.5 mg/dL (ref 0.3–1.2)
Total Protein: 7.4 g/dL (ref 6.5–8.1)

## 2017-03-27 LAB — CBC WITH DIFFERENTIAL/PLATELET
Basophils Absolute: 0 10*3/uL (ref 0–0.1)
Basophils Relative: 1 %
EOS ABS: 0.2 10*3/uL (ref 0–0.7)
EOS PCT: 3 %
HCT: 36 % (ref 35.0–47.0)
Hemoglobin: 12.2 g/dL (ref 12.0–16.0)
LYMPHS ABS: 3 10*3/uL (ref 1.0–3.6)
Lymphocytes Relative: 39 %
MCH: 26.8 pg (ref 26.0–34.0)
MCHC: 33.8 g/dL (ref 32.0–36.0)
MCV: 79.2 fL — ABNORMAL LOW (ref 80.0–100.0)
Monocytes Absolute: 0.4 10*3/uL (ref 0.2–0.9)
Monocytes Relative: 5 %
Neutro Abs: 4.1 10*3/uL (ref 1.4–6.5)
Neutrophils Relative %: 52 %
Platelets: 216 10*3/uL (ref 150–440)
RBC: 4.55 MIL/uL (ref 3.80–5.20)
RDW: 16.1 % — AB (ref 11.5–14.5)
WBC: 7.8 10*3/uL (ref 3.6–11.0)

## 2017-03-27 LAB — TROPONIN I
Troponin I: <0.03 ng/mL (ref <0.03)
Troponin I: <0.03 ng/mL (ref <0.03)

## 2017-03-27 MED ORDER — SODIUM CHLORIDE 0.9 % IV SOLN
1.0000 g | Freq: Once | INTRAVENOUS | Status: AC
  Administered 2017-03-27: 1 g via INTRAVENOUS
  Filled 2017-03-27: qty 10

## 2017-03-27 MED ORDER — HYDROCODONE-ACETAMINOPHEN 5-325 MG PO TABS: 1.0000 | ORAL_TABLET | Freq: Four times a day (QID) | ORAL | 0 refills | Status: DC | PRN

## 2017-03-27 MED ORDER — KETOROLAC TROMETHAMINE 30 MG/ML IJ SOLN
10.0000 mg | Freq: Once | INTRAMUSCULAR | Status: AC
  Administered 2017-03-27: 9.9 mg via INTRAVENOUS
  Filled 2017-03-27: qty 1

## 2017-03-27 MED ORDER — HYDROCODONE-ACETAMINOPHEN 5-325 MG PO TABS
1.0000 | ORAL_TABLET | Freq: Once | ORAL | Status: AC
  Administered 2017-03-27: 1 via ORAL
  Filled 2017-03-27: qty 1

## 2017-03-27 MED ORDER — CEPHALEXIN 500 MG PO CAPS: 500.0000 mg | ORAL_CAPSULE | Freq: Three times a day (TID) | ORAL | 0 refills | Status: DC

## 2017-03-27 MED ORDER — IBUPROFEN 800 MG PO TABS: 800.0000 mg | ORAL_TABLET | Freq: Three times a day (TID) | ORAL | 0 refills | Status: DC | PRN

## 2017-03-27 NOTE — ED Triage Notes (Signed)
Patient ambulatory to triage with steady gait, without difficulty or distress noted; pt reports upper CP radiating around into back this evening with no accomp symptoms; also c/o hematuria x week

## 2017-03-27 NOTE — Discharge Instructions (Signed)
1.  Take antibiotic as prescribed (Keflex 500 mg 3 times daily for 7 days). 2.  You may take pain medicines as needed (Motrin/Norco #15). 3.  Apply moist heat to affected area several times daily. 4.  Return to the ER for worsening symptoms, persistent vomiting, difficulty breathing or other concerns.

## 2017-03-27 NOTE — ED Provider Notes (Signed)
[facility] Emergency Department Provider Note   ____________________________________________   First MD Initiated Contact with Patient 03/27/17 0532     (approximate)  I have reviewed the triage vital signs and the nursing notes.   HISTORY  Chief Complaint Chest Pain    HPI Kathleen Martinez is a 52 y.o. female who presents to the ED from home with a chief complaint of chest pain.  Patient reports pain under her left breast and over her left lateral and posterior ribs starting approximately 6 PM while eating beef and vegetables.  Describes aching-type pain without associated diaphoresis, shortness of breath, nausea/vomiting, rotations or dizziness.  She was diagnosed with influenza 2 weeks ago and cough remains.  Also notes hematuria for the past week without abdominal or flank pain.  Denies fever, chills, shortness of breath, dysuria.  Has noted urinary frequency.  Denies recent travel, trauma or hormone use.   Past Medical History:  Diagnosis Date  . Chronic constipation   . Chronic fatigue   . Low serum vitamin D   . Menopausal state     Patient Active Problem List   Diagnosis Date Noted  . Bronchitis, acute, with bronchospasm 02/01/2015  . Chronic constipation 09/13/2014  . Low serum vitamin D 09/13/2014  . Obesity, Class I, BMI 30.0-34.9 (see actual BMI) 09/13/2014  . Elevated LDL cholesterol level 09/13/2014  . Left knee pain 09/13/2014    Past Surgical History:  Procedure Laterality Date  . TUBAL LIGATION      Prior to Admission medications   Medication Sig Start Date End Date Taking? Authorizing Provider  chlorpheniramine-HYDROcodone (TUSSIONEX PENNKINETIC ER) 10-8 MG/5ML SUER Take 5 mLs by mouth every 12 (twelve) hours as needed for cough. 03/03/17   Sherrie Mustache Roselyn Bering, PA-C  cholecalciferol (VITAMIN D) 1000 units tablet Take 1,000 Units by mouth daily.    [provider]  fluticasone (FLONASE) 50 MCG/ACT nasal spray Place 1 spray  into both nostrils daily as needed for allergies or rhinitis.    [provider]  ibuprofen (ADVIL,MOTRIN) 600 MG tablet Take 1 tablet (600 mg total) by mouth every 8 (eight) hours as needed. 09/02/16   Merrily Brittle, MD  oseltamivir (TAMIFLU) 75 MG capsule Take 1 capsule (75 mg total) by mouth 2 (two) times daily. 03/03/17   Faythe Ghee, PA-C    Allergies Patient has no known allergies.  Family History  Problem Relation Age of Onset  . Diabetes Mother   . Hypertension Father   . Diabetes Sister   . Diabetes Brother     Social History Social History   Tobacco Use  . Smoking status: Current Every Day Smoker    Packs/day: 0.50    Years: 21.00    Pack years: 10.50    Types: Cigarettes  . Smokeless tobacco: Never Used  Substance Use Topics  . Alcohol use: Yes    Alcohol/week: 0.0 oz    Comment: occasional  . Drug use: No    Review of Systems  Constitutional: No fever/chills. Eyes: No visual changes. ENT: No sore throat. Cardiovascular: Positive for chest pain. Respiratory: Denies shortness of breath. Gastrointestinal: No abdominal pain.  No nausea, no vomiting.  No diarrhea.  No constipation. Genitourinary: Positive for hematuria and frequency.  Negative for dysuria. Musculoskeletal: Negative for back pain. Skin: Negative for rash. Neurological: Negative for headaches, focal weakness or numbness.   ____________________________________________   PHYSICAL EXAM:  VITAL SIGNS: ED Triage Vitals  Enc Vitals Group  BP 03/27/17 0024 125/71     Pulse Rate 03/27/17 0024 73     Resp 03/27/17 0524 17     Temp 03/27/17 0024 97.7 F (36.5 C)     Temp Source 03/27/17 0024 Oral     SpO2 03/27/17 0024 100 %     Weight 03/27/17 0019 190 lb (86.2 kg)     Height 03/27/17 0019 5\' 4"  (1.626 m)     Head Circumference --      Peak Flow --      Pain Score 03/27/17 0019 5     Pain Loc --      Pain Edu? --      Excl. in GC? --     Constitutional: Alert and  oriented. Well appearing and in no acute distress. Eyes: Conjunctivae are normal. PERRL. EOMI. Head: Atraumatic. Nose: No congestion/rhinnorhea. Mouth/Throat: Mucous membranes are moist.  Oropharynx non-erythematous. Neck: No stridor.  No carotid bruits. Cardiovascular: Normal rate, regular rhythm. Grossly normal heart sounds.  Good peripheral circulation. Respiratory: Normal respiratory effort.  No retractions. Lungs CTAB.  Left anterior chest, lateral ribs tender to palpation.  No crepitus.  No splinting. Gastrointestinal: Soft and nontender. No distention. No abdominal bruits. No CVA tenderness. Musculoskeletal: No lower extremity tenderness nor edema.  No joint effusions. Neurologic:  Normal speech and language. No gross focal neurologic deficits are appreciated. No gait instability. Skin:  Skin is warm, dry and intact. No rash noted. Psychiatric: Mood and affect are normal. Speech and behavior are normal.  ____________________________________________   LABS (all labs ordered are listed, but only abnormal results are displayed)  Labs Reviewed  URINALYSIS, COMPLETE (UACMP) WITH MICROSCOPIC - Abnormal; Notable for the following components:      Result Value   Color, Urine AMBER (*)    APPearance HAZY (*)    Hgb urine dipstick LARGE (*)    Protein, ur 100 (*)    Leukocytes, UA TRACE (*)    Bacteria, UA FEW (*)    All other components within normal limits  CBC WITH DIFFERENTIAL/PLATELET - Abnormal; Notable for the following components:   MCV 79.2 (*)    RDW 16.1 (*)    All other components within normal limits  COMPREHENSIVE METABOLIC PANEL - Abnormal; Notable for the following components:   Glucose, Bld 109 (*)    Creatinine, Ser 1.24 (*)    ALT 13 (*)    GFR calc non Af Amer 49 (*)    GFR calc Af Amer 57 (*)    All other components within normal limits  URINE CULTURE  TROPONIN I  TROPONIN I   ____________________________________________  EKG  ED ECG REPORT I,  Kamaiyah Uselton J, the attending physician, personally viewed and interpreted this ECG.   Date: 03/27/2017  EKG Time: 0027  Rate: 66  Rhythm: normal EKG, normal sinus rhythm  Axis: Normal  Intervals:none  ST&T Change: Nonspecific  ____________________________________________  RADIOLOGY  ED MD interpretation: No pneumonia  Official radiology report(s): Dg Chest 2 View  Result Date: 03/27/2017 CLINICAL DATA:  Acute onset of upper chest pain, radiating to the back. Hematuria. EXAM: CHEST - 2 VIEW COMPARISON:  Chest radiograph performed 03/03/2017 FINDINGS: The lungs are well-aerated and clear. There is no evidence of focal opacification, pleural effusion or pneumothorax. The heart is normal in size; the mediastinal contour is within normal limits. No acute osseous abnormalities are seen. IMPRESSION: No acute cardiopulmonary process seen. Electronically Signed   By: Roanna RaiderJeffery  Chang M.D.   On: 03/27/2017  01:11    ____________________________________________   PROCEDURES  Procedure(s) performed: None  Procedures  Critical Care performed: No  ____________________________________________   INITIAL IMPRESSION / ASSESSMENT AND PLAN / ED COURSE  As part of my medical decision making, I reviewed the following data within the electronic MEDICAL RECORD NUMBER History obtained from family, Nursing notes reviewed and incorporated, Labs reviewed, EKG interpreted, Old chart reviewed, Radiograph reviewed and Notes from prior ED visits   52 year old female with recent influenza who presents with reproducible chest pain. Differential diagnosis includes, but is not limited to, ACS, aortic dissection, pulmonary embolism, cardiac tamponade, pneumothorax, pneumonia, pericarditis, myocarditis, GI-related causes including esophagitis/gastritis, and musculoskeletal chest wall pain.    Also notes hematuria without associated abdominal or flank pain.  Laboratory, imaging and urinalysis results remarkable for  cystitis.  Initial troponin unremarkable.  Will repeat troponin, initiate IV antibiotic and reassess.  Clinical Course as of Mar 28 638  Thu Mar 27, 2017  0640 Pain improved.  Resting in no acute distress.  Updated patient and spouse of negative repeat troponin.  Will discharge home on antibiotics for UTI, NSAIDs, analgesia and patient will follow-up with her PCP early next week.  Strict return precautions given.  Both verbalize understanding and agree with plan of care.  [JS]    Clinical Course User Index [JS] Irean Hong, MD     ____________________________________________   FINAL CLINICAL IMPRESSION(S) / ED DIAGNOSES  Final diagnoses:  Nonspecific chest pain  Chest wall pain  Urinary tract infection with hematuria, site unspecified     ED Discharge Orders    None       Note:  This document was prepared using Dragon voice recognition software and may include unintentional dictation errors.    Irean Hong, MD 03/27/17 [phone]

## 2017-03-31 LAB — URINE CULTURE

## 2018-01-16 LAB — HM PAP SMEAR: HM Pap smear: NORMAL

## 2018-12-24 DIAGNOSIS — Z720 Tobacco use: Secondary | ICD-10-CM | POA: Insufficient documentation

## 2018-12-24 DIAGNOSIS — D573 Sickle-cell trait: Secondary | ICD-10-CM | POA: Insufficient documentation

## 2019-02-13 IMAGING — CT CT ANGIO CHEST
2 of 7 series · 18 of 36 positions shown · IV contrast (APPLIED)
Comparison: None.

CLINICAL DATA: Short of breath and intermittent chest pain for 2
weeks

EXAM:
CT ANGIOGRAPHY CHEST WITH CONTRAST
TECHNIQUE: Multidetector CT imaging of the chest was performed using the
standard protocol during bolus administration of intravenous
contrast. Multiplanar CT image reconstructions and MIPs were
obtained to evaluate the vascular anatomy.
CONTRAST:  75 cc Isovue 370

[Series 5: thins · axial · 0.67mm/px · z∈[-606,-384]mm · 15 of 255 slices shown]
[im 16/255  lung]
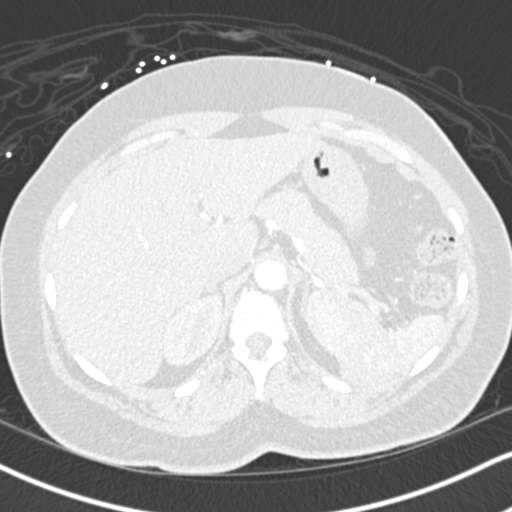
[im 32/255  mediastinal]
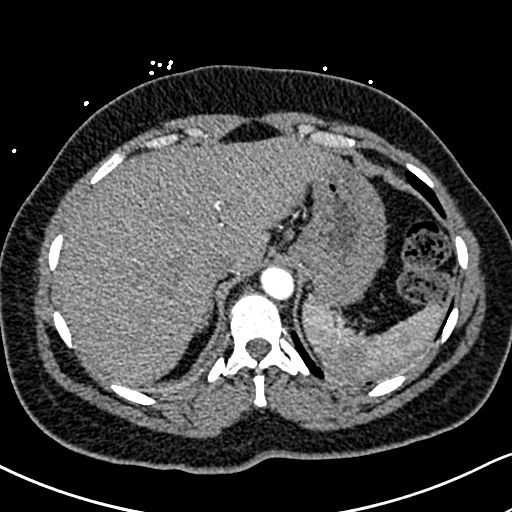
[im 48/255  lung]
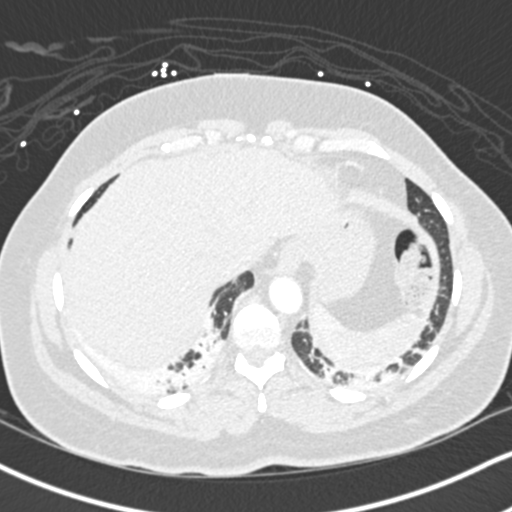
[im 64/255  mediastinal]
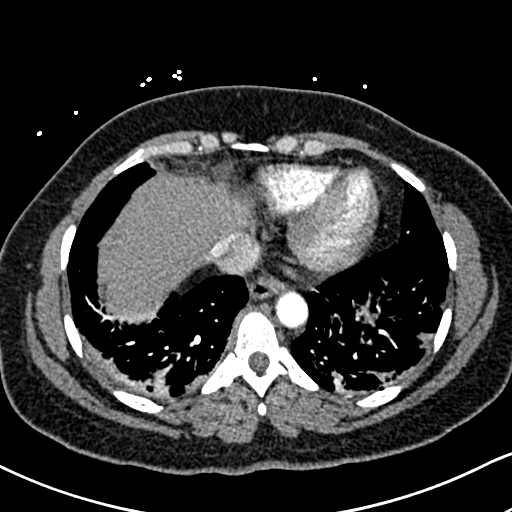
[im 80/255  lung]
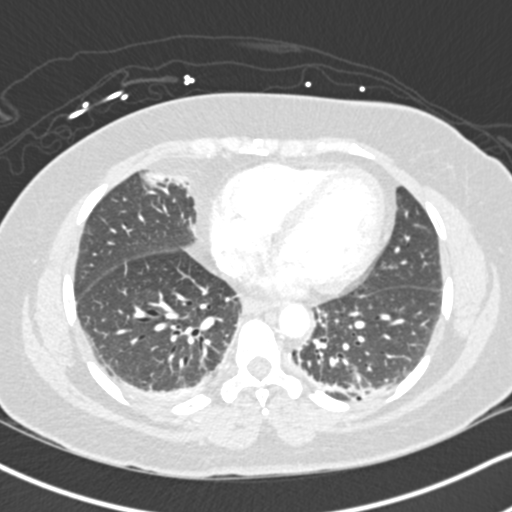
[im 96/255  mediastinal]
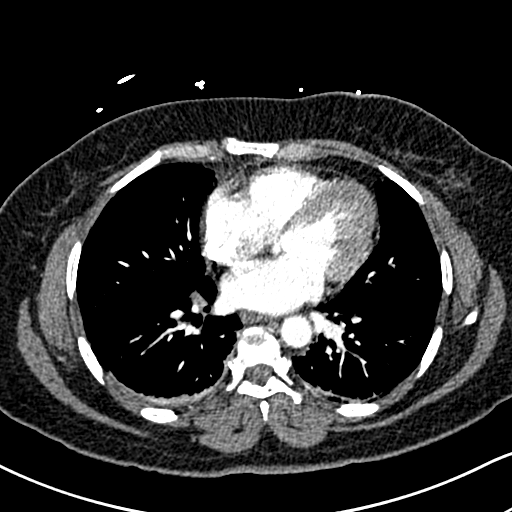
[im 112/255  lung]
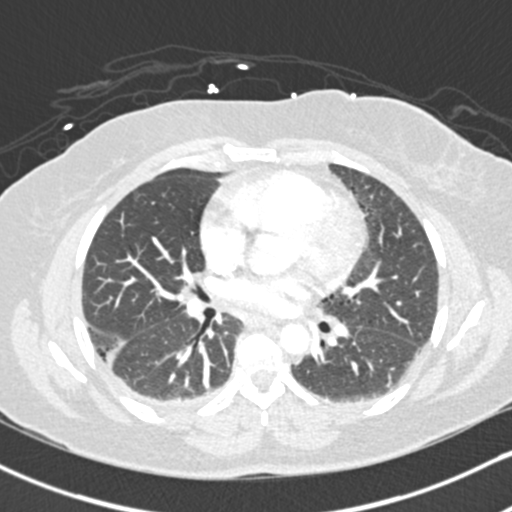
[im 128/255  mediastinal]
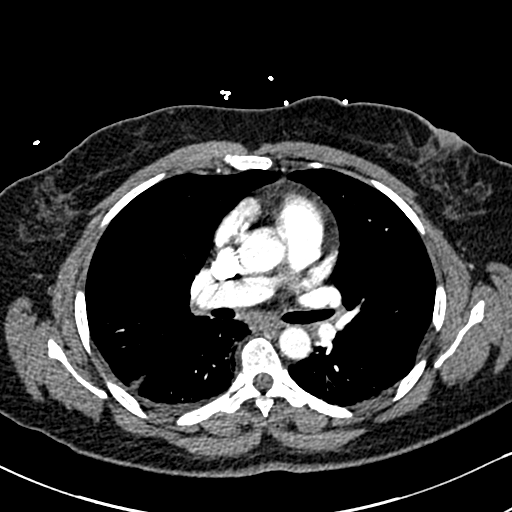
[im 143/255  lung]
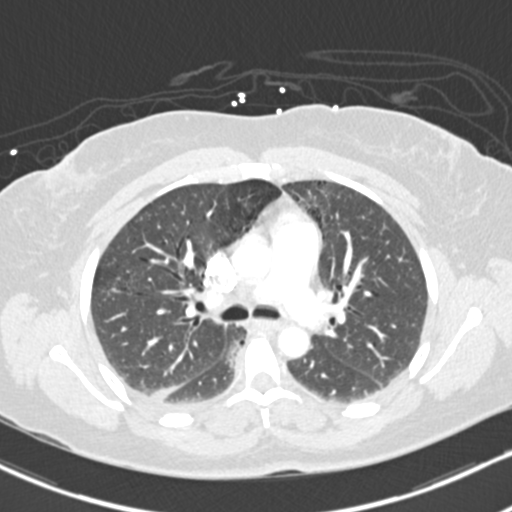
[im 159/255  mediastinal]
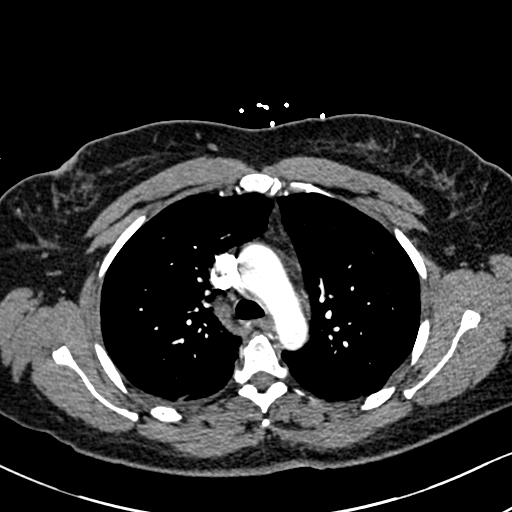
[im 175/255  lung]
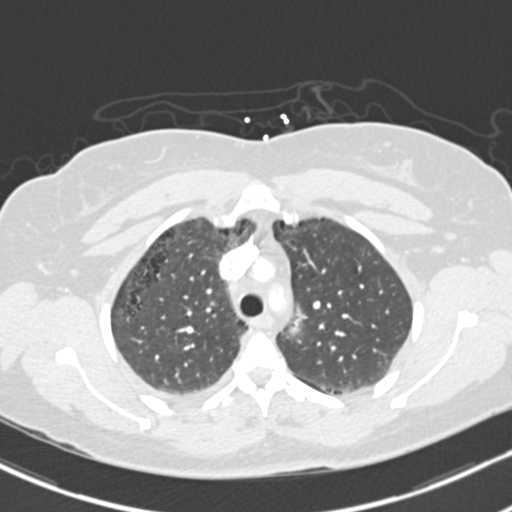
[im 191/255  mediastinal]
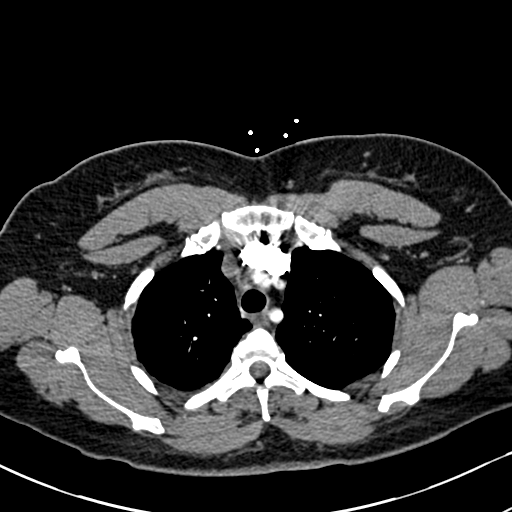
[im 207/255  lung]
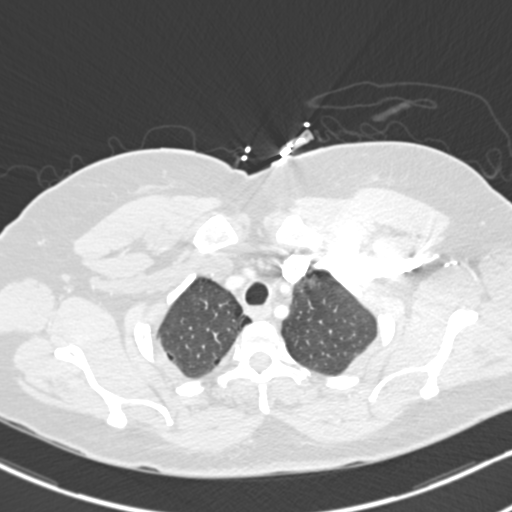
[im 223/255  mediastinal]
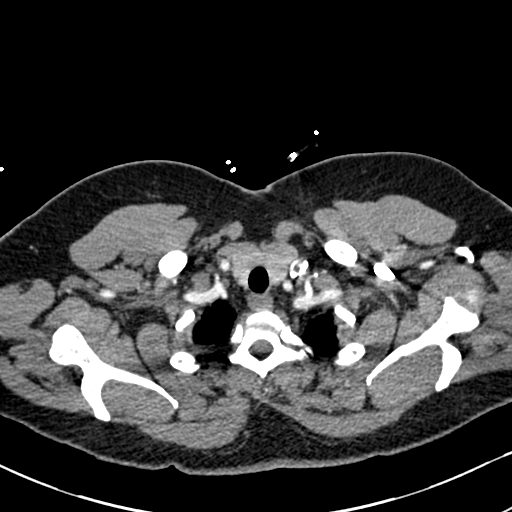
[im 239/255  lung]
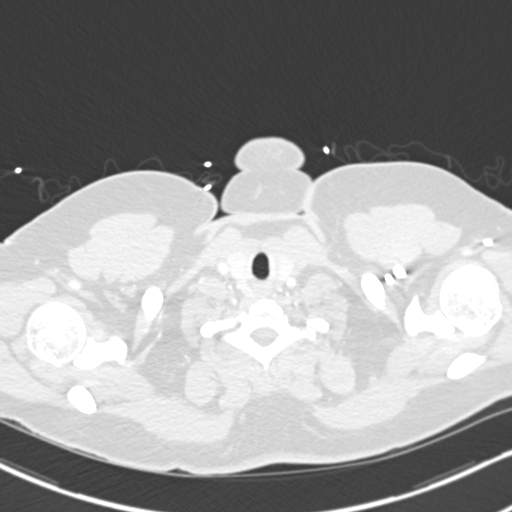

[Series 6: lung · axial · 0.67mm/px · z∈[-538,-424]mm · 3 of 77 slices shown]
[im 20/77  mediastinal]
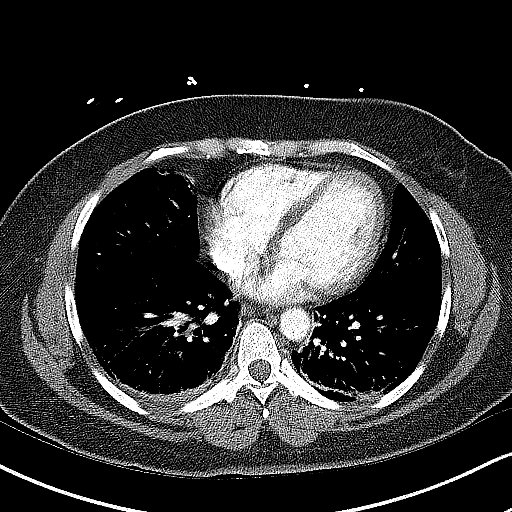
[im 39/77  mediastinal]
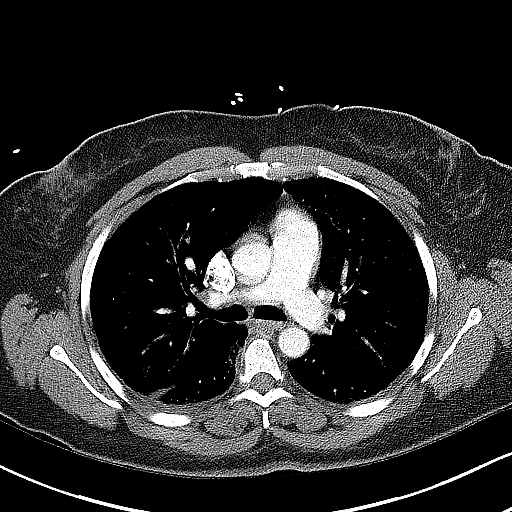
[im 58/77  mediastinal]
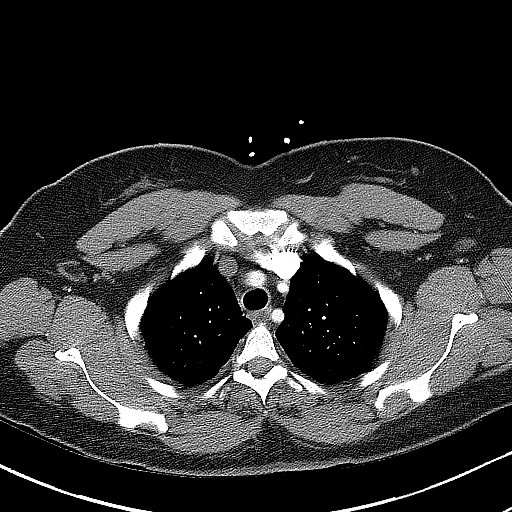

[18 of 36 positions shown; findings below may reference images not displayed]

FINDINGS: Cardiovascular: There are no filling defects in the pulmonary
arterial tree to suggest acute pulmonary thromboembolism.

Mild atherosclerotic calcification of the aortic arch and origin of
the left subclavian artery. Great vessels are grossly patent within
the confines of the examination including the bilateral vertebral
arteries.

No evidence of aortic dissection or intramural hematoma.

Mediastinum/Nodes: Small bilateral hilar and mediastinal nodes are
present. 8 mm short axis diameter precarinal node on image 36. Small
bilateral hilar nodes on image 47. No pericardial effusion. Thyroid
is unremarkable. Esophagus is unremarkable.

Lungs/Pleura: Tiny bilateral pleural effusions. No pneumothorax.
Dependent atelectasis. There are at least 2 focal patchy areas of
ground-glass in the right upper lobe. On image 43, the more anterior
larger entity measures 2.5 cm. There is a indeterminate density in
the right upper lobe containing solid and sub solid elements. The
solid component measures 5 mm on image 35. Patchy peripheral
emphysema in both upper lobes right greater than left.

Upper Abdomen: No acute abnormality.

Musculoskeletal: No vertebral compression deformity. No evidence of
acute rib fracture.

Review of the MIP images confirms the above findings.
IMPRESSION: No evidence of acute pulmonary embolism or acute vascular pathology.

Small mediastinal and hilar nodes are likely related to volume
overload worn inflammatory process.

2.5 cm ground-glass opacity in the right upper lobe. Initial
follow-up by chest CT without contrast is recommended in 3 months to
confirm persistence. This recommendation follows the consensus
statement: Recommendations for the Management of Subsolid Pulmonary
Nodules Detected at CT: A Statement from the [HOSPITAL] as
published in Radiology 2305; [DATE].

Solid and sub solid nodule in the right upper lobe. The solid
component measures 5 mm. Initial follow-up by chest CT without
contrast is recommended in 3 months to confirm persistence. This
recommendation follows the consensus statement: Recommendations for
the Management of Subsolid Pulmonary Nodules Detected at CT: A
Statement from the [HOSPITAL] as published in Radiology
2305; [DATE].

Aortic Atherosclerosis (1Z1QR-Z7G.G) and Emphysema ([id number].B).

## 2019-02-13 IMAGING — CR DG CHEST 2V
2 series · 2 of 2 positions shown · non-contrast
Comparison: None.

CLINICAL DATA: Intermittent RIGHT chest pain for 2 weeks. Sinus
congestion.

EXAM:
CHEST  2 VIEW

[chest pa]
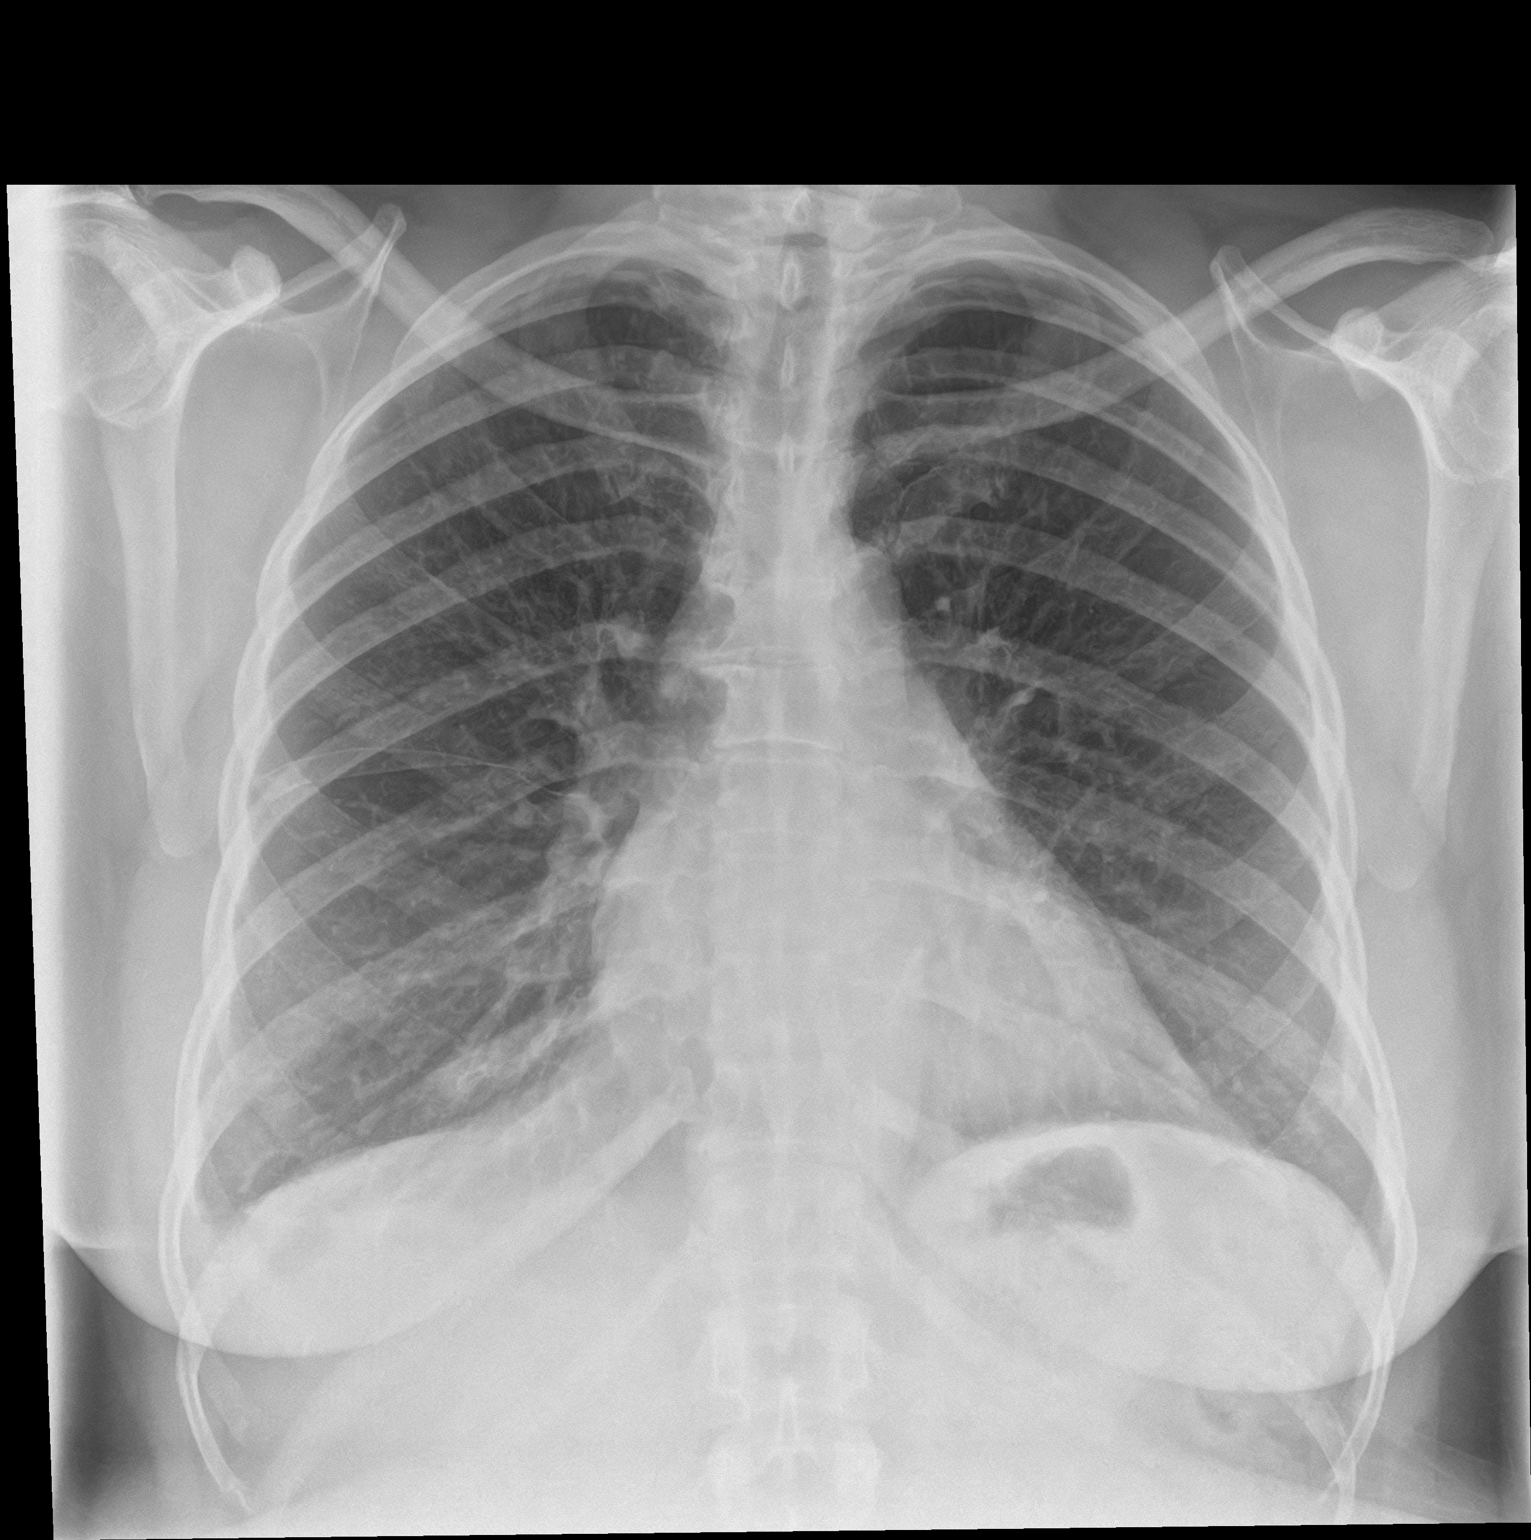

[chest lat]
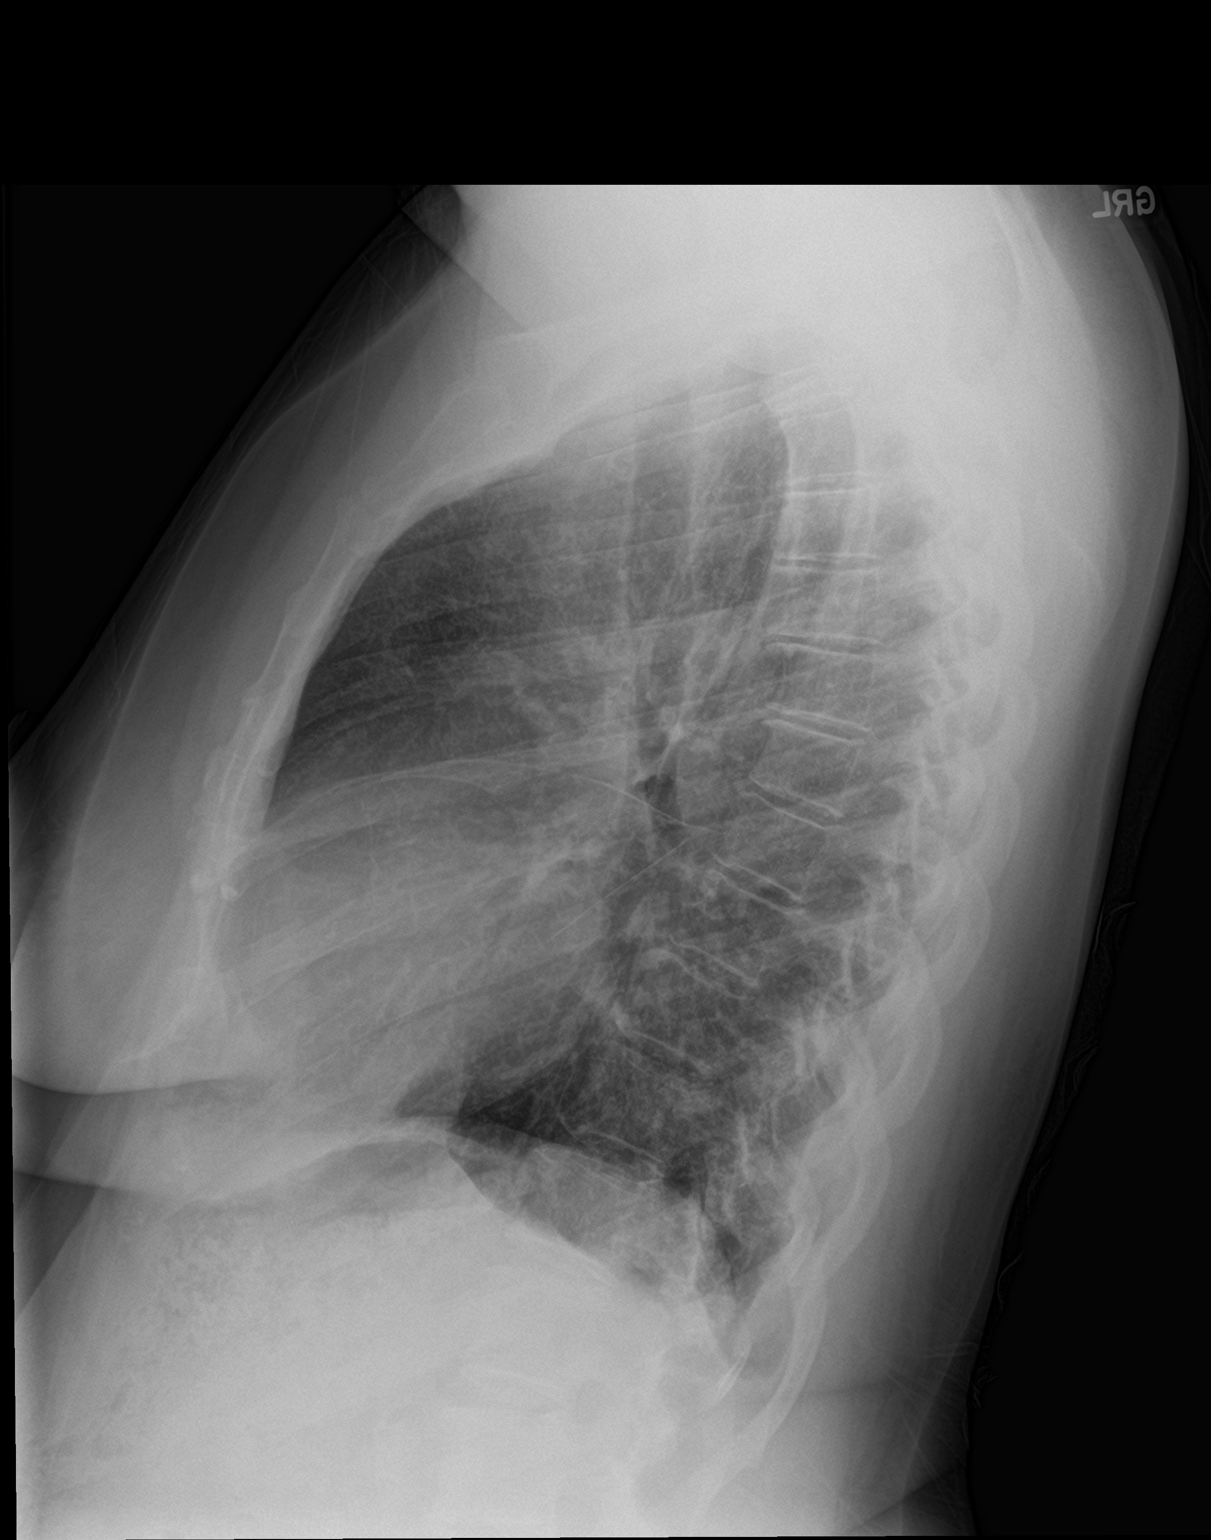

[2 of 2 positions shown; findings below may reference images not displayed]

FINDINGS: Cardiomediastinal silhouette is normal. Trace RIGHT pleural
effusion. No focal consolidation. No pneumothorax. Soft tissue
planes and included osseous structures are normal.
IMPRESSION: Trace RIGHT pleural effusion.

## 2020-01-17 ENCOUNTER — Ambulatory Visit: Payer: BLUE CROSS/BLUE SHIELD | Admitting: Family Medicine

## 2020-01-26 ENCOUNTER — Ambulatory Visit (INDEPENDENT_AMBULATORY_CARE_PROVIDER_SITE_OTHER): Payer: BLUE CROSS/BLUE SHIELD | Admitting: Family Medicine

## 2020-01-26 ENCOUNTER — Encounter: Payer: Self-pay | Admitting: Family Medicine

## 2020-01-26 ENCOUNTER — Other Ambulatory Visit: Payer: Self-pay

## 2020-01-26 ENCOUNTER — Other Ambulatory Visit: Payer: Self-pay | Admitting: Family Medicine

## 2020-01-26 VITALS — BP 129/70 | HR 71 | Temp 97.5°F | Resp 18 | Ht 62.0 in | Wt 202.6 lb

## 2020-01-26 DIAGNOSIS — Z716 Tobacco abuse counseling: Secondary | ICD-10-CM | POA: Insufficient documentation

## 2020-01-26 DIAGNOSIS — Z7689 Persons encountering health services in other specified circumstances: Secondary | ICD-10-CM | POA: Insufficient documentation

## 2020-01-26 DIAGNOSIS — K219 Gastro-esophageal reflux disease without esophagitis: Secondary | ICD-10-CM | POA: Insufficient documentation

## 2020-01-26 MED ORDER — NICOTINE POLACRILEX 4 MG MT GUM: 4.0000 mg | CHEWING_GUM | OROMUCOSAL | 0 refills | Status: DC | PRN

## 2020-01-26 MED ORDER — OMEPRAZOLE 20 MG PO CPDR
20.0000 mg | DELAYED_RELEASE_CAPSULE | Freq: Every day | ORAL | 0 refills | Status: DC
Start: 2020-01-26 — End: 2020-01-26

## 2020-01-26 NOTE — Assessment & Plan Note (Signed)
Interested in smoking cessation, has previously quit "cold [place]" with success in the past.  Is interested in trial with nicorette gum.  Reviewed how to use and unsure if insurance will cover, but will send in rx.

## 2020-01-26 NOTE — Progress Notes (Signed)
Subjective:    Patient ID: Kathleen Martinez, female    DOB: 03/13/1965, 55 y.o.   MRN: [id number]  Kathleen Martinez is a 55 y.o. female presenting on 01/26/2020 for Establish Care (Intermittent Acid reflux, heartburn, burning in the chest and throat area x . Smoking cessation )   HPI  Kathleen Martinez presents to clinic as a new patient to establish for primary care.  Previous PCP was at [facility] at [facility].  Records will be requested.  Past medical, family, and surgical history reviewed w/ pt.  She has acute concerns today for restarting on acid reflux medication and nicorette gum for smoking cessation.  States had taken an over the counter PPI in the past with some relief and that she had made dietary changes, which she has not continued with worsening of reflux, heart burn in chest and throat over the past 6 months.  Depression screen [facility] 2/9 01/26/2020 02/01/2015 09/13/2014  Decreased Interest 0 0 0  Down, Depressed, Hopeless 0 0 0  PHQ - 2 Score 0 0 0    Social History   Tobacco Use  . Smoking status: Current Every Day Smoker    Packs/day: 0.50    Years: 21.00    Pack years: 10.50    Types: Cigarettes  . Smokeless tobacco: Never Used  Vaping Use  . Vaping Use: Never used  Substance Use Topics  . Alcohol use: Yes    Alcohol/week: 0.0 standard drinks    Comment: occasional  . Drug use: No    Review of Systems  Constitutional: Negative.   HENT: Negative.   Eyes: Negative.   Respiratory: Negative.   Cardiovascular: Negative.   Gastrointestinal: Negative.        Heartburn, reflux  Endocrine: Negative.   Genitourinary: Negative.   Musculoskeletal: Negative.   Skin: Negative.   Allergic/Immunologic: Negative.   Neurological: Negative.   Hematological: Negative.   Psychiatric/Behavioral: Negative.    Per HPI unless specifically indicated above     Objective:    BP 129/70 (BP Location: Right Arm, Patient Position: Sitting, Cuff Size: Large)   Pulse 71   Temp (!)  97.5 F (36.4 C) (Temporal)   Resp 18   Ht 5\' 2"  (1.575 m)   Wt 202 lb 9.6 oz (91.9 kg)   SpO2 100%   BMI 37.06 kg/m   Wt Readings from Last 3 Encounters:  01/26/20 202 lb 9.6 oz (91.9 kg)  03/27/17 190 lb (86.2 kg)  03/03/17 197 lb (89.4 kg)    Physical Exam Vitals and nursing note reviewed.  Constitutional:      General: She is not in acute distress.    Appearance: Normal appearance. She is well-developed and well-groomed. She is obese. She is not ill-appearing or toxic-appearing.  HENT:     Head: Normocephalic and atraumatic.     Nose:     Comments: 03/05/17 is in place, covering mouth and nose. Eyes:     General: Lids are normal. Vision grossly intact.        Right eye: No discharge.        Left eye: No discharge.     Extraocular Movements: Extraocular movements intact.     Conjunctiva/sclera: Conjunctivae normal.     Pupils: Pupils are equal, round, and reactive to light.  Cardiovascular:     Rate and Rhythm: Normal rate and regular rhythm.     Pulses: Normal pulses.     Heart sounds: Normal heart sounds. No murmur heard.  No friction rub. No gallop.   Pulmonary:     Effort: Pulmonary effort is normal. No respiratory distress.     Breath sounds: Normal breath sounds.  Skin:    General: Skin is warm and dry.     Capillary Refill: Capillary refill takes less than 2 seconds.  Neurological:     General: No focal deficit present.     Mental Status: She is alert and oriented to person, place, and time.  Psychiatric:        Attention and Perception: Attention and perception normal.        Mood and Affect: Mood and affect normal.        Speech: Speech normal.        Behavior: Behavior normal. Behavior is cooperative.        Thought Content: Thought content normal.        Cognition and Memory: Cognition and memory normal.        Judgment: Judgment normal.    Results for orders placed or performed during the hospital encounter of 03/27/17  Urine culture   Specimen:  Urine, Clean Catch  Result Value Ref Range   Specimen Description      URINE, CLEAN CATCH Performed at [facility], [address] [place], [place] [address]    Special Requests      NONE Performed at [facility], [address] [place], [place] [address]    Culture >=100,000 COLONIES/mL CITROBACTER KOSERI (A)    Report Status 03/31/2017 FINAL    Organism ID, Bacteria CITROBACTER KOSERI (A)       Susceptibility   Citrobacter koseri - MIC*    CEFAZOLIN <=4 SENSITIVE Sensitive     CEFTRIAXONE <=1 SENSITIVE Sensitive     CIPROFLOXACIN <=0.25 SENSITIVE Sensitive     GENTAMICIN <=1 SENSITIVE Sensitive     IMIPENEM <=0.25 SENSITIVE Sensitive     NITROFURANTOIN 32 SENSITIVE Sensitive     TRIMETH/SULFA <=20 SENSITIVE Sensitive     PIP/TAZO <=4 SENSITIVE Sensitive     * >=100,000 COLONIES/mL CITROBACTER KOSERI  Troponin I  Result Value Ref Range   Troponin I <0.03 <0.03 ng/mL  Urinalysis, Complete w Microscopic  Result Value Ref Range   Color, Urine AMBER (A) YELLOW   APPearance HAZY (A) CLEAR   Specific Gravity, Urine 1.008 1.005 - 1.030   pH 6.0 5.0 - 8.0   Glucose, UA NEGATIVE NEGATIVE mg/dL   Hgb urine dipstick LARGE (A) NEGATIVE   Bilirubin Urine NEGATIVE NEGATIVE   Ketones, ur NEGATIVE NEGATIVE mg/dL   Protein, ur 166 (A) NEGATIVE mg/dL   Nitrite NEGATIVE NEGATIVE   Leukocytes, UA TRACE (A) NEGATIVE   RBC / HPF TOO NUMEROUS TO COUNT 0 - 5 RBC/hpf   WBC, UA TOO NUMEROUS TO COUNT 0 - 5 WBC/hpf   Bacteria, UA FEW (A) NONE SEEN   Squamous Epithelial / LPF NONE SEEN NONE SEEN  CBC with Differential  Result Value Ref Range   WBC 7.8 3.6 - 11.0 K/uL   RBC 4.55 3.80 - 5.20 MIL/uL   Hemoglobin 12.2 12.0 - 16.0 g/dL   HCT 06.3 01.6 - 01.0 %   MCV 79.2 (L) 80.0 - 100.0 fL   MCH 26.8 26.0 - 34.0 pg   MCHC 33.8 32.0 - 36.0 g/dL   RDW 93.2 (H) 35.5 - 73.2 %   Platelets 216 150 - 440 K/uL   Neutrophils Relative % 52 %   Neutro Abs 4.1 1.4 - 6.5 K/uL  Lymphocytes Relative 39 %   Lymphs Abs 3.0 1.0 - 3.6 K/uL   Monocytes Relative 5 %   Monocytes Absolute 0.4 0.2 - 0.9 K/uL   Eosinophils Relative 3 %   Eosinophils Absolute 0.2 0 - 0.7 K/uL   Basophils Relative 1 %   Basophils Absolute 0.0 0 - 0.1 K/uL  Comprehensive metabolic panel  Result Value Ref Range   Sodium 140 135 - 145 mmol/L   Potassium 3.6 3.5 - 5.1 mmol/L   Chloride 105 101 - 111 mmol/L   CO2 26 22 - 32 mmol/L   Glucose, Bld 109 (H) 65 - 99 mg/dL   BUN 17 6 - 20 mg/dL   Creatinine, Ser 9.21 (H) 0.44 - 1.00 mg/dL   Calcium 9.1 8.9 - 19.4 mg/dL   Total Protein 7.4 6.5 - 8.1 g/dL   Albumin 4.0 3.5 - 5.0 g/dL   AST 17 15 - 41 U/L   ALT 13 (L) 14 - 54 U/L   Alkaline Phosphatase 60 38 - 126 U/L   Total Bilirubin 0.5 0.3 - 1.2 mg/dL   GFR calc non Af Amer 49 (L) >60 mL/min   GFR calc Af Amer 57 (L) >60 mL/min   Anion gap 9 5 - 15  Troponin I  Result Value Ref Range   Troponin I <0.03 <0.03 ng/mL      Assessment & Plan:   Problem List Items Addressed This Visit      Digestive   Gastroesophageal reflux disease    Will restart on omeprazole 20mg  daily and to work on reducing acidic foods in diet.  Reviewed need to seek care if globus sensation, difficulty swallowing, s/sx of GI bleed.  Follow up as needed and in 3 months.       Relevant Medications   omeprazole (PRILOSEC) 20 MG capsule     Other   Encounter for smoking cessation counseling    Interested in smoking cessation, has previously quit "cold " with success in the past.  Is interested in trial with nicorette gum.  Reviewed how to use and unsure if insurance will cover, but will send in rx.        Relevant Medications   nicotine polacrilex (NICORETTE) 4 MG gum   Encounter to establish care with new doctor - Primary      Meds ordered this encounter  Medications  . omeprazole (PRILOSEC) 20 MG capsule    Sig: Take 1 capsule (20 mg total) by mouth daily.    Dispense:  90 capsule    Refill:  0   . nicotine polacrilex (NICORETTE) 4 MG gum    Sig: Take 1 each (4 mg total) by mouth as needed for smoking cessation.    Dispense:  100 tablet    Refill:  0   Follow up plan: Return in about 3 months (around 04/25/2020) for CPE + PAP testing.   06/25/2020, FNP Family Nurse Practitioner [facility] 01/26/2020, 11:04 AM

## 2020-01-26 NOTE — Assessment & Plan Note (Signed)
Will restart on omeprazole 20mg  daily and to work on reducing acidic foods in diet.  Reviewed need to seek care if globus sensation, difficulty swallowing, s/sx of GI bleed.  Follow up as needed and in 3 months.

## 2020-01-26 NOTE — Patient Instructions (Signed)
I have sent in a prescription for omperazole 20mg  to take 1 tablet daily for your acid reflux.  As we discussed, it may take up to 2 weeks to see symptom improvement.    I have sent in a prescription for nicorette gum to use as directed.  We will plan to see you back in 3 months for your physical  You will receive a survey after today's visit either digitally by e-mail or paper by USPS mail. Your experiences and feedback matter to .  Please respond so we know how we are doing as we provide care for you.  Call us with any questions/concerns/needs.  It is my goal to be available to you for your health concerns.  Thanks for choosing me to be a partner in your healthcare needs!  [place], FNP-C Family Nurse Practitioner [facility] Phone: [phone]

## 2020-02-01 ENCOUNTER — Ambulatory Visit: Payer: Self-pay | Attending: Internal Medicine

## 2020-02-01 ENCOUNTER — Other Ambulatory Visit: Payer: Self-pay | Admitting: Internal Medicine

## 2020-02-01 ENCOUNTER — Other Ambulatory Visit: Payer: Self-pay

## 2020-02-08 ENCOUNTER — Ambulatory Visit: Payer: Self-pay | Attending: Internal Medicine

## 2020-02-08 DIAGNOSIS — Z23 Encounter for immunization: Secondary | ICD-10-CM

## 2020-02-08 NOTE — Progress Notes (Signed)
   Covid-19 Vaccination Clinic  Name:  Kathleen Martinez    MRN: [id number] DOB: 04/22/1965  02/08/2020  [name] was observed post Covid-19 immunization for 15 minutes without incident. She was provided with Vaccine Information Sheet and instruction to access the V-Safe system.   [name] was instructed to call 911 with any severe reactions post vaccine: Marland Kitchen Difficulty breathing  . Swelling of face and throat  . A fast heartbeat  . A bad rash all over body  . Dizziness and weakness   Immunizations Administered    Name Date Dose VIS Date Route   Moderna Covid-19 Booster Vaccine 02/08/2020  2:48 PM 0.25 mL 11/03/2019 Intramuscular   Manufacturer: Moderna   Lot: [id number]   NDC: 38333-832-91

## 2020-05-11 ENCOUNTER — Ambulatory Visit (INDEPENDENT_AMBULATORY_CARE_PROVIDER_SITE_OTHER): Payer: 59 | Admitting: Internal Medicine

## 2020-05-11 ENCOUNTER — Encounter: Payer: Self-pay | Admitting: Internal Medicine

## 2020-05-11 ENCOUNTER — Other Ambulatory Visit: Payer: Self-pay

## 2020-05-11 VITALS — BP 134/72 | HR 60 | Temp 97.7°F | Resp 17 | Ht 62.0 in | Wt 204.2 lb

## 2020-05-11 DIAGNOSIS — E6609 Other obesity due to excess calories: Secondary | ICD-10-CM | POA: Diagnosis not present

## 2020-05-11 DIAGNOSIS — K219 Gastro-esophageal reflux disease without esophagitis: Secondary | ICD-10-CM

## 2020-05-11 DIAGNOSIS — Z0001 Encounter for general adult medical examination with abnormal findings: Secondary | ICD-10-CM | POA: Diagnosis not present

## 2020-05-11 DIAGNOSIS — Z1211 Encounter for screening for malignant neoplasm of colon: Secondary | ICD-10-CM | POA: Diagnosis not present

## 2020-05-11 DIAGNOSIS — Z6837 Body mass index (BMI) 37.0-37.9, adult: Secondary | ICD-10-CM

## 2020-05-11 DIAGNOSIS — K5909 Other constipation: Secondary | ICD-10-CM | POA: Diagnosis not present

## 2020-05-11 DIAGNOSIS — Z1231 Encounter for screening mammogram for malignant neoplasm of breast: Secondary | ICD-10-CM

## 2020-05-11 DIAGNOSIS — Z114 Encounter for screening for human immunodeficiency virus [HIV]: Secondary | ICD-10-CM | POA: Diagnosis not present

## 2020-05-11 DIAGNOSIS — Z1159 Encounter for screening for other viral diseases: Secondary | ICD-10-CM

## 2020-05-11 LAB — CBC: WBC: 6.6 10*3/uL (ref 3.8–10.8)

## 2020-05-11 MED ORDER — NICOTINE POLACRILEX 4 MG MT GUM
CHEWING_GUM | OROMUCOSAL | 0 refills | Status: DC
  Filled 2020-05-11: qty 110, 15d supply, fill #0

## 2020-05-11 NOTE — Patient Instructions (Signed)

## 2020-05-11 NOTE — Assessment & Plan Note (Signed)
Continue MiraLAX every other day Encourage adequate water intake and high-fiber diet

## 2020-05-11 NOTE — Assessment & Plan Note (Signed)
Avoid foods that trigger your reflux Encouraged weight loss as this can help improve reflux symptoms CBC and c-Met today Continue Omeprazole

## 2020-05-11 NOTE — Progress Notes (Signed)
Subjective:    Patient ID: Kathleen Martinez, female    DOB: Dec 19, 1965, 55 y.o.   MRN: [id number]  HPI  Patient presents the clinic today for her annual exam.  She is also due to follow up chronic conditions. She is establishing care with me today, transferring care from Cyndia Skeeters, NP.  GERD: Triggered by spicy or fried foods.  She denies breakthrough on Omeprazole.  There is no upper GI on file.  Chronic Constipation: Managed with Mirilax every other day.  Flu: 10/2018 Tetanus: 09/2017 COVID: Moderna x3 Pneumovax: 09/2017 Pap smear: 01/2018 Mammogram: > 5 years ago Colon screening: never Vision screen: as needed Dentist: as needed  Diet: She does eat meat. She consumes fruits and veggies. She does eat some fried foods. She drinks mostly Ginger Ale, water Exercise: None  Review of Systems      Past Medical History:  Diagnosis Date  . Chronic constipation   . Chronic fatigue   . Low serum vitamin D   . Menopausal state     Current Outpatient Medications  Medication Sig Dispense Refill  . cholecalciferol (VITAMIN D) 1000 units tablet Take 1,000 Units by mouth daily.    Marland Kitchen COVID-19 mRNA vaccine, Moderna, 100 MCG/0.5ML injection AS DIRECTED .25 mL 0  . nicotine polacrilex (NICORETTE) 4 MG gum USE AS DIRECTED AS NEEDED FOR SMOKING CESSATION 110 each 0  . omeprazole (PRILOSEC) 20 MG capsule TAKE 1 CAPSULE (20 MG TOTAL) BY MOUTH DAILY. 90 capsule 0   No current facility-administered medications for this visit.    No Known Allergies  Family History  Problem Relation Age of Onset  . Diabetes Mother   . Hypertension Father   . Diabetes Sister   . Diabetes Brother     Social History   Socioeconomic History  . Marital status: Married    Spouse name: Not on file  . Number of children: Not on file  . Years of education: Not on file  . Highest education level: Not on file  Occupational History  . Not on file  Tobacco Use  . Smoking status: Current Every Day Smoker     Packs/day: 0.50    Years: 21.00    Pack years: 10.50    Types: Cigarettes  . Smokeless tobacco: Never Used  Vaping Use  . Vaping Use: Never used  Substance and Sexual Activity  . Alcohol use: Yes    Alcohol/week: 0.0 standard drinks    Comment: occasional  . Drug use: No  . Sexual activity: Never  Other Topics Concern  . Not on file  Social History Narrative  . Not on file   Social Determinants of Health   Financial Resource Strain: Not on file  Food Insecurity: Not on file  Transportation Needs: Not on file  Physical Activity: Not on file  Stress: Not on file  Social Connections: Not on file  Intimate Partner Violence: Not on file     Constitutional: Denies fever, malaise, fatigue, headache or abrupt weight changes.  HEENT: Denies eye pain, eye redness, ear pain, ringing in the ears, wax buildup, runny nose, nasal congestion, bloody nose, or sore throat. Respiratory: Denies difficulty breathing, shortness of breath, cough or sputum production.   Cardiovascular: Denies chest pain, chest tightness, palpitations or swelling in the hands or feet.  Gastrointestinal: Pt reports chronic constipation. Denies abdominal pain, bloating, diarrhea or blood in the stool.  GU: Denies urgency, frequency, pain with urination, burning sensation, blood in urine, odor or discharge. Musculoskeletal:  Denies decrease in range of motion, difficulty with gait, muscle pain or joint pain and swelling.  Skin: Denies redness, rashes, lesions or ulcercations.  Neurological: Denies dizziness, difficulty with memory, difficulty with speech or problems with balance and coordination.  Psych: Denies anxiety, depression, SI/HI.  No other specific complaints in a complete review of systems (except as listed in HPI above).  Objective:   Physical Exam   BP 134/72 (BP Location: Right Arm, Patient Position: Sitting, Cuff Size: Normal)   Pulse 60   Temp 97.7 F (36.5 C) (Temporal)   Resp 17   Ht $R'5\' 2"'Se$   (1.575 m)   Wt 204 lb 3.2 oz (92.6 kg)   SpO2 100%   BMI 37.35 kg/m   Wt Readings from Last 3 Encounters:  01/26/20 202 lb 9.6 oz (91.9 kg)  03/27/17 190 lb (86.2 kg)  03/03/17 197 lb (89.4 kg)    General: Appears her stated age, obese, in NAD. Skin: Warm, dry and intact. No rashes noted. HEENT: Head: normal shape and size; Eyes: sclera white, no icterus, conjunctiva pink, PERRLA and EOMs intact;  Neck:  Neck supple, trachea midline. No masses, lumps present. Thyromegaly noted. Cardiovascular: Normal rate and rhythm. S1,S2 noted.  No murmur, rubs or gallops noted. No JVD or BLE edema. No carotid bruits noted. Pulmonary/Chest: Normal effort and positive vesicular breath sounds. No respiratory distress. No wheezes, rales or ronchi noted.  Abdomen: Soft and nontender. Normal bowel sounds. No distention or masses noted. Liver, spleen and kidneys non palpable. Musculoskeletal: Strength 5/5 BUE/BLE. No difficulty with gait.  Neurological: Alert and oriented. Cranial nerves II-XII grossly intact. Coordination normal.  Psychiatric: Mood and affect flat. Behavior is normal. Judgment and thought content normal.     BMET    Component Value Date/Time   NA 140 03/27/2017 0032   K 3.6 03/27/2017 0032   CL 105 03/27/2017 0032   CO2 26 03/27/2017 0032   GLUCOSE 109 (H) 03/27/2017 0032   BUN 17 03/27/2017 0032   CREATININE 1.24 (H) 03/27/2017 0032   CALCIUM 9.1 03/27/2017 0032   GFRNONAA 49 (L) 03/27/2017 0032   GFRAA 57 (L) 03/27/2017 0032    Lipid Panel  No results found for: CHOL, TRIG, HDL, CHOLHDL, VLDL, LDLCALC  CBC    Component Value Date/Time   WBC 7.8 03/27/2017 0032   RBC 4.55 03/27/2017 0032   HGB 12.2 03/27/2017 0032   HCT 36.0 03/27/2017 0032   PLT 216 03/27/2017 0032   MCV 79.2 (L) 03/27/2017 0032   MCH 26.8 03/27/2017 0032   MCHC 33.8 03/27/2017 0032   RDW 16.1 (H) 03/27/2017 0032   LYMPHSABS 3.0 03/27/2017 0032   MONOABS 0.4 03/27/2017 0032   EOSABS 0.2  03/27/2017 0032   BASOSABS 0.0 03/27/2017 0032    Hgb A1C No results found for: HGBA1C         Assessment & Plan:   Preventative Health Maintenance:  Encouraged her to get a flu shot in the fall Tetanus UTD COVID UTD She has had a pneumonia vaccine although this was not indicated given her age and medical history Pap smear UTD Mammogram ordered, she will call [facility] McGregor to schedule Referral to GI for screening colonoscopy Encouraged her to consume a balanced diet and exercise regimen Advised her to see an eye doctor and dentist annually We will check CBC, c-Met, TSH, lipid, A1c, HIV and hep C today  RTC in 1 year, sooner if needed Webb Silversmith, NP This visit occurred during the  SARS-CoV-2 public health emergency.  Safety protocols were in place, including screening questions prior to the visit, additional usage of staff PPE, and extensive cleaning of exam room while observing appropriate contact time as indicated for disinfecting solutions.

## 2020-05-12 LAB — COMPREHENSIVE METABOLIC PANEL
AG Ratio: 1.6 (calc) (ref 1.0–2.5)
ALT: 15 U/L (ref 6–29)
AST: 20 U/L (ref 10–35)
Albumin: 4.5 g/dL (ref 3.6–5.1)
Alkaline phosphatase (APISO): 70 U/L (ref 37–153)
BUN/Creatinine Ratio: 18 (calc) (ref 6–22)
BUN: 21 mg/dL (ref 7–25)
CO2: 27 mmol/L (ref 20–32)
Calcium: 9.9 mg/dL (ref 8.6–10.4)
Chloride: 106 mmol/L (ref 98–110)
Creat: 1.15 mg/dL — ABNORMAL HIGH (ref 0.50–1.05)
Globulin: 2.8 g/dL (calc) (ref 1.9–3.7)
Glucose, Bld: 89 mg/dL (ref 65–99)
Potassium: 4.5 mmol/L (ref 3.5–5.3)
Sodium: 141 mmol/L (ref 135–146)
Total Bilirubin: 0.5 mg/dL (ref 0.2–1.2)
Total Protein: 7.3 g/dL (ref 6.1–8.1)

## 2020-05-12 LAB — HEMOGLOBIN A1C
Hgb A1c MFr Bld: 5.7 % of total Hgb — ABNORMAL HIGH (ref <5.7)
Mean Plasma Glucose: 117 mg/dL
eAG (mmol/L): 6.5 mmol/L

## 2020-05-12 LAB — CBC
HCT: 45.3 % — ABNORMAL HIGH (ref 35.0–45.0)
Hemoglobin: 14.2 g/dL (ref 11.7–15.5)
MCH: 25.9 pg — ABNORMAL LOW (ref 27.0–33.0)
MCHC: 31.3 g/dL — ABNORMAL LOW (ref 32.0–36.0)
MCV: 82.5 fL (ref 80.0–100.0)
MPV: 11.1 fL (ref 7.5–12.5)
Platelets: 237 10*3/uL (ref 140–400)
RBC: 5.49 10*6/uL — ABNORMAL HIGH (ref 3.80–5.10)
RDW: 14.7 % (ref 11.0–15.0)

## 2020-05-12 LAB — HEPATITIS C ANTIBODY
Hepatitis C Ab: NONREACTIVE
SIGNAL TO CUT-OFF: 0.02 (ref <1.00)

## 2020-05-12 LAB — LIPID PANEL
Cholesterol: 182 mg/dL (ref <200)
HDL: 46 mg/dL — ABNORMAL LOW (ref >=50)
LDL Cholesterol (Calc): 119 mg/dL (calc) — ABNORMAL HIGH
Non-HDL Cholesterol (Calc): 136 mg/dL (calc) — ABNORMAL HIGH (ref <130)
Total CHOL/HDL Ratio: 4 (calc) (ref <5.0)
Triglycerides: 74 mg/dL (ref <150)

## 2020-05-12 LAB — HIV ANTIBODY (ROUTINE TESTING W REFLEX): HIV 1&2 Ab, 4th Generation: NONREACTIVE

## 2020-05-12 LAB — TSH: TSH: 1.25 mIU/L

## 2020-05-15 ENCOUNTER — Telehealth: Payer: Self-pay

## 2020-05-15 NOTE — Telephone Encounter (Signed)
-----   Message from Cleotilde Neer, RN sent at 05/12/2020  5:03 PM EDT ----- Pt given lab results per notes of R. Sampson Si, NP on 05/12/20. Pt verbalized understanding. Patient reports she is still smoking. Patient will call back to scheduled f/u appt for 6 months.

## 2020-05-23 ENCOUNTER — Other Ambulatory Visit: Payer: Self-pay

## 2020-05-23 ENCOUNTER — Ambulatory Visit
Admission: RE | Admit: 2020-05-23 | Discharge: 2020-05-23 | Disposition: A | Discharge status: Home / Self Care | Payer: 59 | Source: Ambulatory Visit | Attending: Internal Medicine | Admitting: Internal Medicine

## 2020-05-23 DIAGNOSIS — Z1231 Encounter for screening mammogram for malignant neoplasm of breast: Secondary | ICD-10-CM | POA: Insufficient documentation

## 2020-05-24 ENCOUNTER — Telehealth: Payer: Self-pay

## 2020-05-24 NOTE — Telephone Encounter (Signed)
Gastroenterology Pre-Procedure Review  Made appointment to See Dr. Servando Snare first since having constipation   PATIENT REVIEW QUESTIONS: The patient responded to the following health history questions as indicated:    1. Are you having any GI issues? Yes constipation has bowel movement every 1 to 2 weeks  2. Do you have a personal history of Polyps? no 3. Do you have a family history of Colon Cancer or Polyps? no 4. Diabetes Mellitus? no 5. Joint replacements in the past 12 months?no 6. Major health problems in the past 3 months?no 7. Any artificial heart valves, MVP, or defibrillator?no    MEDICATIONS & ALLERGIES:    Patient reports the following regarding taking any anticoagulation/antiplatelet therapy:   Plavix, Coumadin, Eliquis, Xarelto, Lovenox, Pradaxa, Brilinta, or Effient? no Aspirin? no  Patient confirms/reports the following medications:  Current Outpatient Medications  Medication Sig Dispense Refill  . cholecalciferol (VITAMIN D) 1000 units tablet Take 1,000 Units by mouth daily.    . nicotine polacrilex (NICORETTE) 4 MG gum USE AS DIRECTED AS NEEDED FOR SMOKING CESSATION 110 each 0  . nicotine polacrilex (SM NICOTINE POLACRILEX) 4 MG gum use as directed 110 each 0  . omeprazole (PRILOSEC) 20 MG capsule TAKE 1 CAPSULE (20 MG TOTAL) BY MOUTH DAILY. 90 capsule 0   No current facility-administered medications for this visit.    Patient confirms/reports the following allergies:  No Known Allergies  No orders of the defined types were placed in this encounter.   AUTHORIZATION INFORMATION Primary Insurance: 1D#: Group #:  Secondary Insurance: 1D#: Group #:  SCHEDULE INFORMATION: Date:  Time: Location:

## 2020-06-22 ENCOUNTER — Other Ambulatory Visit: Payer: Self-pay

## 2020-06-22 ENCOUNTER — Ambulatory Visit: Payer: 59 | Admitting: Gastroenterology

## 2020-06-22 ENCOUNTER — Encounter: Payer: Self-pay | Admitting: Gastroenterology

## 2020-06-22 VITALS — BP 102/71 | HR 80 | Temp 97.3°F | Ht 62.0 in | Wt 208.0 lb

## 2020-06-22 DIAGNOSIS — R194 Change in bowel habit: Secondary | ICD-10-CM

## 2020-06-22 DIAGNOSIS — Z1211 Encounter for screening for malignant neoplasm of colon: Secondary | ICD-10-CM

## 2020-06-22 DIAGNOSIS — K5909 Other constipation: Secondary | ICD-10-CM

## 2020-06-22 MED ORDER — NA SULFATE-K SULFATE-MG SULF 17.5-3.13-1.6 GM/177ML PO SOLN
354.0000 mL | Freq: Once | ORAL | 0 refills | Status: AC
  Filled 2020-06-22: qty 354, 1d supply, fill #0

## 2020-06-22 NOTE — Progress Notes (Signed)
Gastroenterology Consultation  Referring Provider:     Lorre Munroe, NP Primary Care Physician:  Lorre Munroe, NP Primary Gastroenterologist:  Dr. Servando Snare     Reason for Consultation:     Constipation        HPI:   Kathleen Martinez is a 55 y.o. y/o female referred for consultation & management of Constipation by Dr. Sampson Si, Salvadore Oxford, NP.  This patient comes in today with a report of constipation.  She states that the constipation has been present the last 3 months.  She denies having any problems with constipation prior to the last 3 months.  There is no report of any family history of colon cancer colon polyps.  The patient also denies any nausea vomiting fevers chills black stools or bloody stools.  The patient denies ever having a colonoscopy in the past.  Besides the constipation the patient states she is in her usual state of health.  Past Medical History:  Diagnosis Date   Chronic constipation    Chronic fatigue    Low serum vitamin D    Menopausal state     Past Surgical History:  Procedure Laterality Date   TUBAL LIGATION      Prior to Admission medications   Medication Sig Start Date End Date Taking? Authorizing Provider  cholecalciferol (VITAMIN D) 1000 units tablet Take 1,000 Units by mouth daily.   Yes [provider]  nicotine polacrilex (NICORETTE) 4 MG gum USE AS DIRECTED AS NEEDED FOR SMOKING CESSATION 01/26/20 01/25/21 Yes Malfi, Jodelle Gross, FNP  nicotine polacrilex (SM NICOTINE POLACRILEX) 4 MG gum use as directed 05/10/20  Yes Nicks, Estelle Grumbles, RPH  omeprazole (PRILOSEC) 20 MG capsule TAKE 1 CAPSULE (20 MG TOTAL) BY MOUTH DAILY. 01/26/20 01/25/21 Yes Malfi, Jodelle Gross, FNP    Family History  Problem Relation Age of Onset   Diabetes Mother    Hypertension Father    Diabetes Sister    Diabetes Brother    Breast cancer Neg Hx      Social History   Tobacco Use   Smoking status: Every Day    Packs/day: 0.50    Years: 21.00    Pack years: 10.50    Types:  Cigarettes   Smokeless tobacco: Never  Vaping Use   Vaping Use: Never used  Substance Use Topics   Alcohol use: Yes    Alcohol/week: 0.0 standard drinks    Comment: occasional   Drug use: No    Allergies as of 06/22/2020   (No Known Allergies)    Review of Systems:    All systems reviewed and negative except where noted in HPI.   Physical Exam:  BP 102/71   Pulse 80   Temp (!) 97.3 F (36.3 C) (Temporal)   Ht 5\' 2"  (1.575 m)   Wt 208 lb (94.3 kg)   BMI 38.04 kg/m  No LMP recorded. Patient is postmenopausal. General:   Alert,  Well-developed, well-nourished, pleasant and cooperative in NAD Head:  Normocephalic and atraumatic. Eyes:  Sclera clear, no icterus.   Conjunctiva pink. Ears:  Normal auditory acuity. Neck:  Supple; no masses or thyromegaly. Lungs:  Respirations even and unlabored.  Clear throughout to auscultation.   No wheezes, crackles, or rhonchi. No acute distress. Heart:  Regular rate and rhythm; no murmurs, clicks, rubs, or gallops. Abdomen:  Normal bowel sounds.  No bruits.  Soft, non-tender and non-distended without masses, hepatosplenomegaly or hernias noted.  No guarding or rebound tenderness.  Negative Carnett sign.   Rectal:  Deferred.  Pulses:  Normal pulses noted. Extremities:  No clubbing or edema.  No cyanosis. Neurologic:  Alert and oriented x3;  grossly normal neurologically. Skin:  Intact without significant lesions or rashes.  No jaundice. Lymph Nodes:  No significant cervical adenopathy. Psych:  Alert and cooperative. Normal mood and affect.  Imaging Studies: MM 3D SCREEN BREAST BILATERAL  Result Date: 05/24/2020 CLINICAL DATA:  Screening. EXAM: DIGITAL SCREENING BILATERAL MAMMOGRAM WITH TOMOSYNTHESIS AND CAD TECHNIQUE: Bilateral screening digital craniocaudal and mediolateral oblique mammograms were obtained. Bilateral screening digital breast tomosynthesis was performed. The images were evaluated with computer-aided detection. COMPARISON:   Previous exam(s). ACR Breast Density Category b: There are scattered areas of fibroglandular density. FINDINGS: There are no findings suspicious for malignancy. The images were evaluated with computer-aided detection. IMPRESSION: No mammographic evidence of malignancy. A result letter of this screening mammogram will be mailed directly to the patient. RECOMMENDATION: Screening mammogram in one year. (Code:SM-B-01Y) BI-RADS CATEGORY  1: Negative. Electronically Signed   By: Norva Pavlov M.D.   On: 05/24/2020 11:51    Assessment and Plan:   Kathleen Martinez is a 55 y.o. y/o female who comes in today with a new onset of constipation without any change in diet or unexplained weight loss. Due to the patient's change in bowel habits she will be set up for colonoscopy since she has never had a colonoscopy in the past.  The patient has been explained the plan and agrees with it.    Midge Minium, MD. Clementeen Graham    Note: This dictation was prepared with Dragon dictation along with smaller phrase technology. Any transcriptional errors that result from this process are unintentional.

## 2020-06-26 ENCOUNTER — Encounter: Payer: Self-pay | Admitting: Gastroenterology

## 2020-06-30 ENCOUNTER — Encounter: Payer: Self-pay | Admitting: Gastroenterology

## 2020-06-30 ENCOUNTER — Other Ambulatory Visit: Payer: Self-pay

## 2020-06-30 ENCOUNTER — Encounter
Admission: RE | Disposition: A | Discharge status: Home / Self Care | Payer: Self-pay | Source: Home / Self Care | Attending: Gastroenterology

## 2020-06-30 ENCOUNTER — Ambulatory Visit: Payer: 59 | Admitting: Anesthesiology

## 2020-06-30 ENCOUNTER — Ambulatory Visit
Admission: RE | Admit: 2020-06-30 | Discharge: 2020-06-30 | Disposition: A | Discharge status: Home / Self Care | Payer: 59 | Attending: Gastroenterology | Admitting: Gastroenterology

## 2020-06-30 DIAGNOSIS — Z1211 Encounter for screening for malignant neoplasm of colon: Secondary | ICD-10-CM | POA: Diagnosis not present

## 2020-06-30 DIAGNOSIS — Z79899 Other long term (current) drug therapy: Secondary | ICD-10-CM | POA: Diagnosis not present

## 2020-06-30 DIAGNOSIS — F17210 Nicotine dependence, cigarettes, uncomplicated: Secondary | ICD-10-CM | POA: Diagnosis not present

## 2020-06-30 HISTORY — DX: Gastro-esophageal reflux disease without esophagitis: K21.9

## 2020-06-30 HISTORY — PX: COLONOSCOPY WITH PROPOFOL: SHX5780

## 2020-06-30 SURGERY — COLONOSCOPY WITH PROPOFOL
Anesthesia: General

## 2020-06-30 MED ORDER — ACETAMINOPHEN 160 MG/5ML PO SOLN: 325.0000 mg | Freq: Once | ORAL | Status: DC

## 2020-06-30 MED ORDER — LIDOCAINE HCL (CARDIAC) PF 100 MG/5ML IV SOSY
PREFILLED_SYRINGE | INTRAVENOUS | Status: DC | PRN
  Administered 2020-06-30: 30 mg via INTRAVENOUS

## 2020-06-30 MED ORDER — PROPOFOL 10 MG/ML IV BOLUS
INTRAVENOUS | Status: DC | PRN
  Administered 2020-06-30: 50 mg via INTRAVENOUS
  Administered 2020-06-30: 30 mg via INTRAVENOUS
  Administered 2020-06-30 (×2): 50 mg via INTRAVENOUS
  Administered 2020-06-30: 100 mg via INTRAVENOUS

## 2020-06-30 MED ORDER — SODIUM CHLORIDE 0.9 % IV SOLN: INTRAVENOUS | Status: DC

## 2020-06-30 MED ORDER — ACETAMINOPHEN 325 MG PO TABS: 325.0000 mg | ORAL_TABLET | Freq: Once | ORAL | Status: DC

## 2020-06-30 MED ORDER — LACTATED RINGERS IV SOLN: INTRAVENOUS | Status: DC

## 2020-06-30 SURGICAL SUPPLY — 22 items
CLIP HMST 235XBRD CATH ROT (MISCELLANEOUS) IMPLANT
CLIP RESOLUTION 360 11X235 (MISCELLANEOUS)
ELECT REM PT RETURN 9FT ADLT (ELECTROSURGICAL)
ELECTRODE REM PT RTRN 9FT ADLT (ELECTROSURGICAL) IMPLANT
FORCEPS BIOP RAD 4 LRG CAP 4 (CUTTING FORCEPS) IMPLANT
GOWN CVR UNV OPN BCK APRN NK (MISCELLANEOUS) ×2 IMPLANT
GOWN ISOL THUMB LOOP REG UNIV (MISCELLANEOUS) ×6
INJECTOR VARIJECT VIN23 (MISCELLANEOUS) IMPLANT
KIT DEFENDO VALVE AND CONN (KITS) IMPLANT
KIT PRC NS LF DISP ENDO (KITS) ×1 IMPLANT
KIT PROCEDURE OLYMPUS (KITS) ×3
MANIFOLD NEPTUNE II (INSTRUMENTS) ×3 IMPLANT
MARKER SPOT ENDO TATTOO 5ML (MISCELLANEOUS) IMPLANT
PROBE APC STR FIRE (PROBE) IMPLANT
RETRIEVER NET ROTH 2.5X230 LF (MISCELLANEOUS) IMPLANT
SNARE COLD EXACTO (MISCELLANEOUS) IMPLANT
SNARE SHORT THROW 13M SML OVAL (MISCELLANEOUS) IMPLANT
SNARE SNG USE RND 15MM (INSTRUMENTS) IMPLANT
SPOT EX ENDOSCOPIC TATTOO (MISCELLANEOUS)
TRAP ETRAP POLY (MISCELLANEOUS) IMPLANT
VARIJECT INJECTOR VIN23 (MISCELLANEOUS)
WATER STERILE IRR 250ML POUR (IV SOLUTION) ×3 IMPLANT

## 2020-06-30 NOTE — Op Note (Signed)
[facility] Gastroenterology Patient Name: Kathleen Martinez Procedure Date: 06/30/2020 9:25 AM MRN: [id number] Account #: [id number] Date of Birth: [birth year] Admit Type: Outpatient Age: 55 Room: [facility] OR ROOM 01 Gender: Female Note Status: Finalized Procedure:             Colonoscopy Indications:           Screening for colorectal malignant neoplasm Providers:             Midge Minium MD, MD Referring MD:          Lorre Munroe (Referring MD) Medicines:             Propofol per Anesthesia Complications:         No immediate complications. Procedure:             Pre-Anesthesia Assessment:                        - Prior to the procedure, a History and Physical was                         performed, and patient medications and allergies were                         reviewed. The patient's tolerance of previous                         anesthesia was also reviewed. The risks and benefits                         of the procedure and the sedation options and risks                         were discussed with the patient. All questions were                         answered, and informed consent was obtained. Prior                         Anticoagulants: The patient has taken no previous                         anticoagulant or antiplatelet agents. ASA Grade                         Assessment: II - A patient with mild systemic disease.                         After reviewing the risks and benefits, the patient                         was deemed in satisfactory condition to undergo the                         procedure.                        After obtaining informed consent, the colonoscope was  passed under direct vision. Throughout the procedure,                         the patient's blood pressure, pulse, and oxygen                         saturations were monitored continuously. The was                         introduced through the anus and advanced  to the the                         cecum, identified by appendiceal orifice and ileocecal                         valve. The colonoscopy was performed without                         difficulty. The patient tolerated the procedure well.                         The quality of the bowel preparation was excellent. Findings:      The perianal and digital rectal examinations were normal.      The colon (entire examined portion) appeared normal. Impression:            - The entire examined colon is normal.                        - No specimens collected. Recommendation:        - Discharge patient to home.                        - Resume previous diet.                        - Continue present medications.                        - Repeat colonoscopy in 10 years for screening                         purposes. Procedure Code(s):     --- Professional ---                        [phone], Colonoscopy, flexible; diagnostic, including                         collection of specimen(s) by brushing or washing, when                         performed (separate procedure) Diagnosis Code(s):     --- Professional ---                        Z12.11, Encounter for screening for malignant neoplasm                         of colon CPT copyright 2019 American Medical Association. All rights reserved. The codes documented in this report are preliminary and  upon coder review may  be revised to meet current compliance requirements. Midge Minium MD, MD 06/30/2020 9:54:17 AM This report has been signed electronically. Number of Addenda: 0 Note Initiated On: 06/30/2020 9:25 AM Scope Withdrawal Time: 0 hours 9 minutes 23 seconds  Total Procedure Duration: 0 hours 14 minutes 56 seconds  Estimated Blood Loss:  Estimated blood loss: none.      [facility]

## 2020-06-30 NOTE — Anesthesia Procedure Notes (Signed)
Procedure Name: MAC Date/Time: 06/30/2020 9:35 AM Performed by: Georga Bora, CRNA Pre-anesthesia Checklist: Patient identified, Emergency Drugs available, Suction available, Patient being monitored and Timeout performed Patient Re-evaluated:Patient Re-evaluated prior to induction Oxygen Delivery Method: Nasal cannula Placement Confirmation: positive ETCO2 and breath sounds checked- equal and bilateral

## 2020-06-30 NOTE — Anesthesia Preprocedure Evaluation (Signed)
Anesthesia Evaluation  Patient identified by MRN, date of birth, ID band Patient awake    Reviewed: Allergy & Precautions, H&P , NPO status , Patient's Chart, lab work & pertinent test results  Airway Mallampati: II  TM Distance: >3 FB Neck ROM: full    Dental no notable dental hx.    Pulmonary Current Smoker and Patient abstained from smoking.,    Pulmonary exam normal breath sounds clear to auscultation       Cardiovascular Normal cardiovascular exam Rhythm:regular Rate:Normal     Neuro/Psych    GI/Hepatic GERD  ,  Endo/Other    Renal/GU      Musculoskeletal   Abdominal   Peds  Hematology   Anesthesia Other Findings   Reproductive/Obstetrics                             Anesthesia Physical Anesthesia Plan  ASA: 2  Anesthesia Plan: General   Post-op Pain Management:    Induction: Intravenous  PONV Risk Score and Plan: 3 and Treatment may vary due to age or medical condition, Propofol infusion and TIVA  Airway Management Planned: Natural Airway  Additional Equipment:   Intra-op Plan:   Post-operative Plan:   Informed Consent: I have reviewed the patients History and Physical, chart, labs and discussed the procedure including the risks, benefits and alternatives for the proposed anesthesia with the patient or authorized representative who has indicated his/her understanding and acceptance.     Dental Advisory Given  Plan Discussed with: CRNA  Anesthesia Plan Comments:         Anesthesia Quick Evaluation

## 2020-06-30 NOTE — H&P (Signed)
Midge Minium, MD [facility] [address], [address] [place], [place] [address] Phone: [phone] Fax : [phone]  Primary Care Physician:  Lorre Munroe, NP Primary Gastroenterologist:  Dr. Servando Snare  Pre-Procedure History & Physical: HPI:  Kathleen Martinez is a 55 y.o. female is here for a screening colonoscopy.   Past Medical History:  Diagnosis Date   Chronic constipation    Chronic fatigue    GERD (gastroesophageal reflux disease)    Low serum vitamin D    Menopausal state     Past Surgical History:  Procedure Laterality Date   TUBAL LIGATION      Prior to Admission medications   Medication Sig Start Date End Date Taking? Authorizing Provider  cholecalciferol (VITAMIN D) 1000 units tablet Take 1,000 Units by mouth daily.   Yes [provider]  nicotine polacrilex (NICORETTE) 4 MG gum USE AS DIRECTED AS NEEDED FOR SMOKING CESSATION 01/26/20 01/25/21 Yes Malfi, Jodelle Gross, FNP  omeprazole (PRILOSEC) 20 MG capsule TAKE 1 CAPSULE (20 MG TOTAL) BY MOUTH DAILY. 01/26/20 01/25/21 Yes Malfi, Jodelle Gross, FNP  nicotine polacrilex (SM NICOTINE POLACRILEX) 4 MG gum use as directed 05/10/20   Holli Humbles, RPH    Allergies as of 06/22/2020   (No Known Allergies)    Family History  Problem Relation Age of Onset   Diabetes Mother    Hypertension Father    Diabetes Sister    Diabetes Brother    Breast cancer Neg Hx     Social History   Socioeconomic History   Marital status: Married    Spouse name: Not on file   Number of children: Not on file   Years of education: Not on file   Highest education level: Not on file  Occupational History   Not on file  Tobacco Use   Smoking status: Every Day    Packs/day: 0.50    Years: 21.00    Pack years: 10.50    Types: Cigarettes   Smokeless tobacco: Never  Vaping Use   Vaping Use: Never used  Substance and Sexual Activity   Alcohol use: Yes    Alcohol/week: 0.0 standard drinks    Comment: occasional   Drug use: No   Sexual  activity: Never  Other Topics Concern   Not on file  Social History Narrative   Not on file   Social Determinants of Health   Financial Resource Strain: Not on file  Food Insecurity: Not on file  Transportation Needs: Not on file  Physical Activity: Not on file  Stress: Not on file  Social Connections: Not on file  Intimate Partner Violence: Not on file    Review of Systems: See HPI, otherwise negative ROS  Physical Exam: BP 109/64   Pulse 64   Temp (!) 97.2 F (36.2 C) (Temporal)   Ht 5\' 2"  (1.575 m)   Wt 91.6 kg   SpO2 97%   BMI 36.95 kg/m  General:   Alert,  pleasant and cooperative in NAD Head:  Normocephalic and atraumatic. Neck:  Supple; no masses or thyromegaly. Lungs:  Clear throughout to auscultation.    Heart:  Regular rate and rhythm. Abdomen:  Soft, nontender and nondistended. Normal bowel sounds, without guarding, and without rebound.   Neurologic:  Alert and  oriented x4;  grossly normal neurologically.  Impression/Plan: Kathleen Martinez is now here to undergo a screening colonoscopy.  Risks, benefits, and alternatives regarding colonoscopy have been reviewed with the patient.  Questions have been answered.  All parties agreeable.

## 2020-06-30 NOTE — Transfer of Care (Signed)
Immediate Anesthesia Transfer of Care Note  Patient: Kathleen Martinez  Procedure(s) Performed: COLONOSCOPY WITH PROPOFOL  Patient Location: PACU  Anesthesia Type: General  Level of Consciousness: awake, alert  and patient cooperative  Airway and Oxygen Therapy: Patient Spontanous Breathing and Patient connected to supplemental oxygen  Post-op Assessment: Post-op Vital signs reviewed, Patient's Cardiovascular Status Stable, Respiratory Function Stable, Patent Airway and No signs of Nausea or vomiting  Post-op Vital Signs: Reviewed and stable  Complications: No notable events documented.

## 2020-06-30 NOTE — Anesthesia Postprocedure Evaluation (Signed)
Anesthesia Post Note  Patient: Kathleen Martinez  Procedure(s) Performed: COLONOSCOPY WITH PROPOFOL     Patient location during evaluation: PACU Anesthesia Type: General Level of consciousness: awake and alert and oriented Pain management: satisfactory to patient Vital Signs Assessment: post-procedure vital signs reviewed and stable Respiratory status: spontaneous breathing, nonlabored ventilation and respiratory function stable Cardiovascular status: blood pressure returned to baseline and stable Postop Assessment: Adequate PO intake and No signs of nausea or vomiting Anesthetic complications: no   No notable events documented.  Cherly [place]

## 2020-08-14 ENCOUNTER — Other Ambulatory Visit: Payer: Self-pay

## 2020-08-14 ENCOUNTER — Other Ambulatory Visit: Payer: Self-pay | Admitting: Internal Medicine

## 2020-08-14 DIAGNOSIS — K219 Gastro-esophageal reflux disease without esophagitis: Secondary | ICD-10-CM

## 2020-08-14 MED ORDER — NICOTINE POLACRILEX 4 MG MT GUM
CHEWING_GUM | OROMUCOSAL | 0 refills | Status: DC
  Filled 2020-08-14: qty 110, 10d supply, fill #0

## 2020-08-14 MED FILL — Omeprazole Cap Delayed Release 20 MG: ORAL | 90 days supply | Qty: 90 | Fill #0 | Status: AC

## 2020-08-15 ENCOUNTER — Other Ambulatory Visit: Payer: Self-pay

## 2020-08-31 ENCOUNTER — Other Ambulatory Visit: Payer: Self-pay

## 2020-08-31 MED ORDER — CARESTART COVID-19 HOME TEST VI KIT
PACK | 0 refills | Status: DC
  Filled 2020-08-31: qty 2, 4d supply, fill #0

## 2020-10-07 ENCOUNTER — Other Ambulatory Visit: Payer: Self-pay

## 2020-10-07 ENCOUNTER — Ambulatory Visit
Admission: EM | Admit: 2020-10-07 | Discharge: 2020-10-07 | Disposition: A | Discharge status: Home / Self Care | Payer: 59 | Attending: Family Medicine | Admitting: Family Medicine

## 2020-10-07 ENCOUNTER — Encounter: Payer: Self-pay | Admitting: Emergency Medicine

## 2020-10-07 DIAGNOSIS — M62838 Other muscle spasm: Secondary | ICD-10-CM

## 2020-10-07 MED ORDER — TIZANIDINE HCL 4 MG PO TABS: 4.0000 mg | ORAL_TABLET | Freq: Two times a day (BID) | ORAL | 0 refills | Status: DC | PRN

## 2020-10-07 MED ORDER — PREDNISONE 20 MG PO TABS: 20.0000 mg | ORAL_TABLET | Freq: Every day | ORAL | 0 refills | Status: AC

## 2020-10-07 NOTE — ED Triage Notes (Signed)
Pt c/o right side neck spasm that started today.

## 2020-10-07 NOTE — ED Provider Notes (Signed)
Kathleen Martinez    CSN: [id number] Arrival date & time: 10/07/20  1448      History   Chief Complaint Chief Complaint  Patient presents with   Neck Pain    HPI Kathleen Martinez is a 55 y.o. female.   HPI Patient presents today with right sided neck pain with spasms. Denies known injury. She lifts and turns a lot for work and is concern that lifting may have caused pain. Similar pattern of neck pain has occurred in the past and was treated with prednisone and muscle relaxant and subsequently resolved. Denies any shoulder pain and associated chest pain.    Past Medical History:  Diagnosis Date   Chronic constipation    Chronic fatigue    GERD (gastroesophageal reflux disease)    Low serum vitamin D    Menopausal state     Patient Active Problem List   Diagnosis Date Noted   Screen for colon cancer    Gastroesophageal reflux disease 01/26/2020   Chronic constipation 09/13/2014    Past Surgical History:  Procedure Laterality Date   COLONOSCOPY WITH PROPOFOL N/A 06/30/2020   Procedure: COLONOSCOPY WITH PROPOFOL;  Surgeon: Lucilla Lame, MD;  Location: [facility];  Service: Endoscopy;  Laterality: N/A;   TUBAL LIGATION      OB History   No obstetric history on file.      Home Medications    Prior to Admission medications   Medication Sig Start Date End Date Taking? Authorizing Provider  cholecalciferol (VITAMIN D) 1000 units tablet Take 1,000 Units by mouth daily.   Yes [provider]  nicotine polacrilex (NICORETTE) 4 MG gum USE AS DIRECTED AS NEEDED FOR SMOKING CESSATION 01/26/20 01/25/21 Yes Malfi, Lupita Raider, FNP  nicotine polacrilex (SM NICOTINE POLACRILEX) 4 MG gum use as directed 08/14/20  Yes Nicks, Ewing Schlein, RPH  omeprazole (PRILOSEC) 20 MG capsule TAKE 1 CAPSULE (20 MG TOTAL) BY MOUTH DAILY. 08/14/20 08/14/21 Yes Baity, Coralie Keens, NP  predniSONE (DELTASONE) 20 MG tablet Take 1 tablet (20 mg total) by mouth daily with breakfast for 5 days. 10/07/20  10/12/20 Yes Scot Jun, FNP  tiZANidine (ZANAFLEX) 4 MG tablet Take 1 tablet (4 mg total) by mouth 2 (two) times daily as needed for muscle spasms. 10/07/20  Yes Scot Jun, FNP  COVID-19 At Home Antigen Test [facility] COVID-19 HOME TEST) KIT use as directed 08/31/20   Letta Median, RPH    Family History Family History  Problem Relation Age of Onset   Diabetes Mother    Hypertension Father    Diabetes Sister    Diabetes Brother    Breast cancer Neg Hx     Social History Social History   Tobacco Use   Smoking status: Every Day    Packs/day: 0.50    Years: 21.00    Pack years: 10.50    Types: Cigarettes   Smokeless tobacco: Never  Vaping Use   Vaping Use: Never used  Substance Use Topics   Alcohol use: Yes    Alcohol/week: 0.0 standard drinks    Comment: occasional   Drug use: No     Allergies   Patient has no known allergies.   Review of Systems Review of Systems Pertinent negatives listed in HPI Physical Exam Triage Vital Signs ED Triage Vitals  Enc Vitals Group     BP 10/07/20 1517 115/81     Pulse Rate 10/07/20 1517 67     Resp 10/07/20 1517 18  Temp 10/07/20 1517 97.9 F (36.6 C)     Temp Source 10/07/20 1517 Oral     SpO2 10/07/20 1517 95 %     Weight --      Height --      Head Circumference --      Peak Flow --      Pain Score 10/07/20 1519 8     Pain Loc --      Pain Edu? --      Excl. in [place]? --    No data found.  Updated Vital Signs BP 115/81 (BP Location: Left Arm)   Pulse 67   Temp 97.9 F (36.6 C) (Oral)   Resp 18   SpO2 95%   Visual Acuity Right Eye Distance:   Left Eye Distance:   Bilateral Distance:    Right Eye Near:   Left Eye Near:    Bilateral Near:     Physical Exam General appearance: alert, well developed, well nourished, cooperative and in no distress Head: Normocephalic, without obvious abnormality, atraumatic Neck: Limited ROM, no thyroid enlargement or palpable adenopathy   Respiratory:  Respirations even and unlabored, normal respiratory rate Heart: rate and rhythm normal. No gallop or murmurs noted on exam  Extremities: No gross deformities Skin: Skin color, texture, turgor normal. No rashes seen  Psych: Appropriate mood and affect. Neurologic: GCS 15, normal coordination, normal gait UC Treatments / Results  Labs (all labs ordered are listed, but only abnormal results are displayed) Labs Reviewed - No data to display  EKG   Radiology No results found.  Procedures Procedures (including critical care time)  Medications Ordered in UC Medications - No data to display  Initial Impression / Assessment and Plan / UC Course  I have reviewed the triage vital signs and the nursing notes.  Pertinent labs & imaging results that were available during my care of the patient were reviewed by me and considered in my medical decision making (see chart for details).    Neck muscle spasm,  Treatment per discharge medication order Imaging deferred as no acute injury occurred and neck pain spasm have developed in the past Following strenuous activity Work note provided. RTC PRN  Final Clinical Impressions(s) / UC Diagnoses   Final diagnoses:  Neck muscle spasm   Discharge Instructions   None    ED Prescriptions     Medication Sig Dispense Auth. Provider   tiZANidine (ZANAFLEX) 4 MG tablet Take 1 tablet (4 mg total) by mouth 2 (two) times daily as needed for muscle spasms. 30 tablet Scot Jun, FNP   predniSONE (DELTASONE) 20 MG tablet Take 1 tablet (20 mg total) by mouth daily with breakfast for 5 days. 5 tablet Scot Jun, FNP      PDMP not reviewed this encounter.   Scot Jun, [place] 10/09/20 2115

## 2020-10-11 ENCOUNTER — Other Ambulatory Visit: Payer: Self-pay

## 2020-10-11 ENCOUNTER — Ambulatory Visit (INDEPENDENT_AMBULATORY_CARE_PROVIDER_SITE_OTHER): Payer: 59

## 2020-10-11 DIAGNOSIS — Z111 Encounter for screening for respiratory tuberculosis: Secondary | ICD-10-CM | POA: Diagnosis not present

## 2020-10-13 ENCOUNTER — Other Ambulatory Visit: Payer: Self-pay

## 2020-10-13 ENCOUNTER — Ambulatory Visit: Payer: 59

## 2020-10-13 DIAGNOSIS — Z111 Encounter for screening for respiratory tuberculosis: Secondary | ICD-10-CM

## 2020-10-13 LAB — TB SKIN TEST
Induration: 0 mm
TB Skin Test: NEGATIVE

## 2020-11-14 ENCOUNTER — Other Ambulatory Visit: Payer: Self-pay

## 2020-11-14 ENCOUNTER — Encounter: Payer: Self-pay | Admitting: Internal Medicine

## 2020-11-14 ENCOUNTER — Ambulatory Visit: Payer: 59 | Admitting: Internal Medicine

## 2020-11-14 VITALS — BP 111/52 | HR 72 | Temp 97.5°F | Resp 18 | Ht 62.0 in | Wt 207.6 lb

## 2020-11-14 DIAGNOSIS — N1831 Chronic kidney disease, stage 3a: Secondary | ICD-10-CM

## 2020-11-14 DIAGNOSIS — Z716 Tobacco abuse counseling: Secondary | ICD-10-CM | POA: Diagnosis not present

## 2020-11-14 DIAGNOSIS — E6609 Other obesity due to excess calories: Secondary | ICD-10-CM | POA: Insufficient documentation

## 2020-11-14 DIAGNOSIS — K219 Gastro-esophageal reflux disease without esophagitis: Secondary | ICD-10-CM

## 2020-11-14 DIAGNOSIS — Z6837 Body mass index (BMI) 37.0-37.9, adult: Secondary | ICD-10-CM | POA: Diagnosis not present

## 2020-11-14 DIAGNOSIS — I700 Atherosclerosis of aorta: Secondary | ICD-10-CM | POA: Diagnosis not present

## 2020-11-14 DIAGNOSIS — Z23 Encounter for immunization: Secondary | ICD-10-CM | POA: Diagnosis not present

## 2020-11-14 DIAGNOSIS — E7800 Pure hypercholesterolemia, unspecified: Secondary | ICD-10-CM

## 2020-11-14 DIAGNOSIS — N183 Chronic kidney disease, stage 3 unspecified: Secondary | ICD-10-CM | POA: Insufficient documentation

## 2020-11-14 DIAGNOSIS — R7303 Prediabetes: Secondary | ICD-10-CM | POA: Diagnosis not present

## 2020-11-14 DIAGNOSIS — E66812 Obesity, class 2: Secondary | ICD-10-CM | POA: Insufficient documentation

## 2020-11-14 MED ORDER — NICOTINE POLACRILEX 4 MG MT GUM
CHEWING_GUM | OROMUCOSAL | 0 refills | Status: DC
  Filled 2020-11-14: qty 100, 15d supply, fill #0

## 2020-11-14 MED ORDER — OMEPRAZOLE 20 MG PO CPDR
DELAYED_RELEASE_CAPSULE | Freq: Every day | ORAL | 1 refills | Status: DC
  Filled 2020-11-14: qty 90, 90d supply, fill #0
  Filled 2021-05-01: qty 90, 90d supply, fill #1

## 2020-11-14 MED ORDER — ASPIRIN 81 MG PO TBEC: 81.0000 mg | DELAYED_RELEASE_TABLET | Freq: Every day | ORAL | 12 refills | Status: AC

## 2020-11-14 NOTE — Assessment & Plan Note (Signed)
CMET and lipid profile today Encouraged her to consume a low fat diet 

## 2020-11-14 NOTE — Assessment & Plan Note (Signed)
Encouraged diet and exercise for weight loss ?

## 2020-11-14 NOTE — Assessment & Plan Note (Signed)
CMET today Encouraged her to avoid NSAID's OTC Encouraged adequate water intake

## 2020-11-14 NOTE — Assessment & Plan Note (Signed)
CMET and lipid profile today Encouraged her to consume a low fat diet Will have her start a baby ASA daily

## 2020-11-14 NOTE — Progress Notes (Signed)
Subjective:    Patient ID: Kathleen Martinez, female    DOB: November 24, 1965, 55 y.o.   MRN: [id number]  HPI  Pt presents to the clinic today for 6 month follow up.  HLD with Aortic Atherosclerosis: Her last LDL was 119, triglycerides 74, 04/2020. She is not taking any cholesterol lowering medication at this time. She has been trying to consume a low fat diet.  CKD 3: Her last creatinine was 1.15, GFR 57, 04/2020. She is not currently on an ACEI/ARB. She does not follow with nephrology.   Prediabetes: Her last A1C was 5.7%, 04/2020. She is not taking any oral diabetic medication at this time. She does not check her sugars.  GERD: She denies breakthrough on Omeprazole. She would like this refilled today. There is no upper GI on file.  Review of Systems     Past Medical History:  Diagnosis Date   Chronic constipation    Chronic fatigue    GERD (gastroesophageal reflux disease)    Low serum vitamin D    Menopausal state     Current Outpatient Medications  Medication Sig Dispense Refill   cholecalciferol (VITAMIN D) 1000 units tablet Take 1,000 Units by mouth daily.     nicotine polacrilex (NICORETTE) 4 MG gum USE AS DIRECTED AS NEEDED FOR SMOKING CESSATION 110 each 0   omeprazole (PRILOSEC) 20 MG capsule TAKE 1 CAPSULE (20 MG TOTAL) BY MOUTH DAILY. 90 capsule 0   tiZANidine (ZANAFLEX) 4 MG tablet Take 1 tablet (4 mg total) by mouth 2 (two) times daily as needed for muscle spasms. 30 tablet 0   COVID-19 At Home Antigen Test ([facility] COVID-19 HOME TEST) KIT use as directed (Patient not taking: Reported on 11/14/2020) 2 kit 0   No current facility-administered medications for this visit.    No Known Allergies  Family History  Problem Relation Age of Onset   Diabetes Mother    Hypertension Father    Diabetes Sister    Diabetes Brother    Breast cancer Neg Hx     Social History   Socioeconomic History   Marital status: Married    Spouse name: Not on file   Number of children: Not  on file   Years of education: Not on file   Highest education level: Not on file  Occupational History   Not on file  Tobacco Use   Smoking status: Every Day    Packs/day: 0.50    Years: 21.00    Pack years: 10.50    Types: Cigarettes   Smokeless tobacco: Never  Vaping Use   Vaping Use: Never used  Substance and Sexual Activity   Alcohol use: Yes    Alcohol/week: 0.0 standard drinks    Comment: occasional   Drug use: No   Sexual activity: Never  Other Topics Concern   Not on file  Social History Narrative   Not on file   Social Determinants of Health   Financial Resource Strain: Not on file  Food Insecurity: Not on file  Transportation Needs: Not on file  Physical Activity: Not on file  Stress: Not on file  Social Connections: Not on file  Intimate Partner Violence: Not on file     Constitutional: Denies fever, malaise, fatigue, headache or abrupt weight changes.  HEENT: Pt reports post nasal drip. Denies eye pain, eye redness, ear pain, ringing in the ears, wax buildup, runny nose, nasal congestion, bloody nose, or sore throat. Respiratory: Denies difficulty breathing, shortness of breath, cough or  sputum production.   Cardiovascular: Denies chest pain, chest tightness, palpitations or swelling in the hands or feet.  Gastrointestinal: Denies abdominal pain, bloating, constipation, diarrhea or blood in the stool.  GU: Denies urgency, frequency, pain with urination, burning sensation, blood in urine, odor or discharge. Skin: Denies redness, rashes, lesions or ulcercations.    No other specific complaints in a complete review of systems (except as listed in HPI above).  Objective:   Physical Exam BP (!) 111/52 (BP Location: Right Arm, Patient Position: Sitting, Cuff Size: Large)   Pulse 72   Temp (!) 97.5 F (36.4 C) (Temporal)   Resp 18   Ht _0  (1.575 m)   Wt 207 lb 9.6 oz (94.2 kg)   SpO2 100%   BMI 37.97 kg/m   Wt Readings from Last 3 Encounters:   06/30/20 202 lb (91.6 kg)  06/22/20 208 lb (94.3 kg)  05/11/20 204 lb 3.2 oz (92.6 kg)    General: Appears her stated age,obese, in NAD. Skin: Warm, dry and intact.  Cardiovascular: Normal rate and rhythm. S1,S2 noted.  No murmur, rubs or gallops noted. No JVD or BLE edema. No carotid bruits noted. Pulmonary/Chest: Normal effort and positive vesicular breath sounds. No respiratory distress. No wheezes, rales or ronchi noted.  Neurological: Alert and oriented.   BMET    Component Value Date/Time   NA 141 05/11/2020 0911   K 4.5 05/11/2020 0911   CL 106 05/11/2020 0911   CO2 27 05/11/2020 0911   GLUCOSE 89 05/11/2020 0911   BUN 21 05/11/2020 0911   CREATININE 1.15 (H) 05/11/2020 0911   CALCIUM 9.9 05/11/2020 0911   GFRNONAA 49 (L) 03/27/2017 0032   GFRAA 57 (L) 03/27/2017 0032    Lipid Panel     Component Value Date/Time   CHOL 182 05/11/2020 0911   TRIG 74 05/11/2020 0911   HDL 46 (L) 05/11/2020 0911   CHOLHDL 4.0 05/11/2020 0911   LDLCALC 119 (H) 05/11/2020 0911    CBC    Component Value Date/Time   WBC 6.6 05/11/2020 0911   RBC 5.49 (H) 05/11/2020 0911   HGB 14.2 05/11/2020 0911   HCT 45.3 (H) 05/11/2020 0911   PLT 237 05/11/2020 0911   MCV 82.5 05/11/2020 0911   MCH 25.9 (L) 05/11/2020 0911   MCHC 31.3 (L) 05/11/2020 0911   RDW 14.7 05/11/2020 0911   LYMPHSABS 3.0 03/27/2017 0032   MONOABS 0.4 03/27/2017 0032   EOSABS 0.2 03/27/2017 0032   BASOSABS 0.0 03/27/2017 0032    Hgb A1C Lab Results  Component Value Date   HGBA1C 5.7 (H) 05/11/2020           Assessment & Plan:   Post Nasal Drip:  Start antihistamine OTC daily at bedtime  RTC in 6 months for your annual exam  Webb Silversmith, NP This visit occurred during the SARS-CoV-2 public health emergency.  Safety protocols were in place, including screening questions prior to the visit, additional usage of staff PPE, and extensive cleaning of exam room while observing appropriate contact time as  indicated for disinfecting solutions.

## 2020-11-14 NOTE — Patient Instructions (Signed)
Heart-Healthy Eating Plan Heart-healthy meal planning includes: Eating less unhealthy fats. Eating more healthy fats. Making other changes in your diet. Talk with your doctor or a diet specialist (dietitian) to create an eating plan that is right for you. What is my plan? Your doctor may recommend an eating plan that includes: Total fat: ______% or less of total calories a day. Saturated fat: ______% or less of total calories a day. Cholesterol: less than _________mg a day. What are tips for following this plan? Cooking Avoid frying your food. Try to bake, boil, grill, or broil it instead. You can also reduce fat by: Removing the skin from poultry. Removing all visible fats from meats. Steaming vegetables in water or broth. Meal planning  At meals, divide your plate into four equal parts: Fill one-half of your plate with vegetables and green salads. Fill one-fourth of your plate with whole grains. Fill one-fourth of your plate with lean protein foods. Eat 4-5 servings of vegetables per day. A serving of vegetables is: 1 cup of raw or cooked vegetables. 2 cups of raw leafy greens. Eat 4-5 servings of fruit per day. A serving of fruit is: 1 medium whole fruit.  cup of dried fruit.  cup of fresh, frozen, or canned fruit.  cup of 100% fruit juice. Eat more foods that have soluble fiber. These are apples, broccoli, carrots, beans, peas, and barley. Try to get 20-30 g of fiber per day. Eat 4-5 servings of nuts, legumes, and seeds per week: 1 serving of dried beans or legumes equals  cup after being cooked. 1 serving of nuts is  cup. 1 serving of seeds equals 1 tablespoon. General information Eat more home-cooked food. Eat less restaurant, buffet, and fast food. Limit or avoid alcohol. Limit foods that are high in starch and sugar. Avoid fried foods. Lose weight if you are overweight. Keep track of how much salt (sodium) you eat. This is important if you have high blood  pressure. Ask your doctor to tell you more about this. Try to add vegetarian meals each week. Fats Choose healthy fats. These include olive oil and canola oil, flaxseeds, walnuts, almonds, and seeds. Eat more omega-3 fats. These include salmon, mackerel, sardines, tuna, flaxseed oil, and ground flaxseeds. Try to eat fish at least 2 times each week. Check food labels. Avoid foods with trans fats or high amounts of saturated fat. Limit saturated fats. These are often found in animal products, such as meats, butter, and cream. These are also found in plant foods, such as palm oil, palm kernel oil, and coconut oil. Avoid foods with partially hydrogenated oils in them. These have trans fats. Examples are stick margarine, some tub margarines, cookies, crackers, and other baked goods. What foods can I eat? Fruits All fresh, canned (in natural juice), or frozen fruits. Vegetables Fresh or frozen vegetables (raw, steamed, roasted, or grilled). Green salads. Grains Most grains. Choose whole wheat and whole grains most of the time. Rice and pasta, including brown rice and pastas made with whole wheat. Meats and other proteins Lean, well-trimmed beef, veal, pork, and lamb. Chicken and turkey without skin. All fish and shellfish. Wild duck, rabbit, pheasant, and venison. Egg whites or low-cholesterol egg substitutes. Dried beans, peas, lentils, and tofu. Seeds and most nuts. Dairy Low-fat or nonfat cheeses, including ricotta and mozzarella. Skim or 1% milk that is liquid, powdered, or evaporated. Buttermilk that is made with low-fat milk. Nonfat or low-fat yogurt. Fats and oils Non-hydrogenated (trans-free) margarines. Vegetable oils, including   soybean, sesame, sunflower, olive, peanut, safflower, corn, canola, and cottonseed. Salad dressings or mayonnaise made with a vegetable oil. Beverages Mineral water. Coffee and tea. Diet carbonated beverages. Sweets and desserts Sherbet, gelatin, and fruit ice.  Small amounts of dark chocolate. Limit all sweets and desserts. Seasonings and condiments All seasonings and condiments. The items listed above may not be a complete list of foods and drinks you can eat. Contact a dietitian for more options. What foods should I avoid? Fruits Canned fruit in heavy syrup. Fruit in cream or butter sauce. Fried fruit. Limit coconut. Vegetables Vegetables cooked in cheese, cream, or butter sauce. Fried vegetables. Grains Breads that are made with saturated or trans fats, oils, or whole milk. Croissants. Sweet rolls. Donuts. High-fat crackers, such as cheese crackers. Meats and other proteins Fatty meats, such as hot dogs, ribs, sausage, bacon, rib-eye roast or steak. High-fat deli meats, such as salami and bologna. Caviar. Domestic duck and goose. Organ meats, such as liver. Dairy Cream, sour cream, cream cheese, and creamed cottage cheese. Whole-milk cheeses. Whole or 2% milk that is liquid, evaporated, or condensed. Whole buttermilk. Cream sauce or high-fat cheese sauce. Yogurt that is made from whole milk. Fats and oils Meat fat, or shortening. Cocoa butter, hydrogenated oils, palm oil, coconut oil, palm kernel oil. Solid fats and shortenings, including bacon fat, salt pork, lard, and butter. Nondairy cream substitutes. Salad dressings with cheese or sour cream. Beverages Regular sodas and juice drinks with added sugar. Sweets and desserts Frosting. Pudding. Cookies. Cakes. Pies. Milk chocolate or white chocolate. Buttered syrups. Full-fat ice cream or ice cream drinks. The items listed above may not be a complete list of foods and drinks to avoid. Contact a dietitian for more information. Summary Heart-healthy meal planning includes eating less unhealthy fats, eating more healthy fats, and making other changes in your diet. Eat a balanced diet. This includes fruits and vegetables, low-fat or nonfat dairy, lean protein, nuts and legumes, whole grains, and  heart-healthy oils and fats. This information is not intended to replace advice given to you by your health care provider. Make sure you discuss any questions you have with your health care provider. Document Revised: 05/11/2020 Document Reviewed: 05/11/2020 Elsevier Patient Education  2022 Elsevier Inc.  

## 2020-11-14 NOTE — Assessment & Plan Note (Signed)
A1C today Encouraged low carb diet and exercise for weight loss 

## 2020-11-14 NOTE — Assessment & Plan Note (Signed)
Try to avoid foods that trigger your reflux Encouraged weight loss as this can help reduce reflux symptoms Omeprazole refilled today

## 2020-11-15 LAB — COMPLETE METABOLIC PANEL WITH GFR
AG Ratio: 1.5 (calc) (ref 1.0–2.5)
ALT: 13 U/L (ref 6–29)
AST: 16 U/L (ref 10–35)
Albumin: 4.3 g/dL (ref 3.6–5.1)
Alkaline phosphatase (APISO): 68 U/L (ref 37–153)
BUN/Creatinine Ratio: 12 (calc) (ref 6–22)
BUN: 15 mg/dL (ref 7–25)
CO2: 25 mmol/L (ref 20–32)
Calcium: 9.6 mg/dL (ref 8.6–10.4)
Chloride: 106 mmol/L (ref 98–110)
Creat: 1.22 mg/dL — ABNORMAL HIGH (ref 0.50–1.03)
Globulin: 2.9 g/dL (calc) (ref 1.9–3.7)
Glucose, Bld: 104 mg/dL — ABNORMAL HIGH (ref 65–99)
Potassium: 4.6 mmol/L (ref 3.5–5.3)
Sodium: 141 mmol/L (ref 135–146)
Total Bilirubin: 0.4 mg/dL (ref 0.2–1.2)
Total Protein: 7.2 g/dL (ref 6.1–8.1)
eGFR: 52 mL/min/{1.73_m2} — ABNORMAL LOW (ref >=60)

## 2020-11-15 LAB — LIPID PANEL
Cholesterol: 191 mg/dL (ref <200)
HDL: 41 mg/dL — ABNORMAL LOW (ref >=50)
LDL Cholesterol (Calc): 132 mg/dL (calc) — ABNORMAL HIGH
Non-HDL Cholesterol (Calc): 150 mg/dL (calc) — ABNORMAL HIGH (ref <130)
Total CHOL/HDL Ratio: 4.7 (calc) (ref <5.0)
Triglycerides: 85 mg/dL (ref <150)

## 2020-11-15 LAB — HEMOGLOBIN A1C
Hgb A1c MFr Bld: 5.7 % of total Hgb — ABNORMAL HIGH (ref <5.7)
Mean Plasma Glucose: 117 mg/dL
eAG (mmol/L): 6.5 mmol/L

## 2021-02-08 ENCOUNTER — Ambulatory Visit: Payer: 59 | Attending: Internal Medicine

## 2021-02-08 ENCOUNTER — Other Ambulatory Visit: Payer: Self-pay

## 2021-02-08 DIAGNOSIS — Z23 Encounter for immunization: Secondary | ICD-10-CM

## 2021-02-08 MED ORDER — MODERNA COVID-19 BIVAL BOOSTER 50 MCG/0.5ML IM SUSP
INTRAMUSCULAR | 0 refills | Status: DC
  Filled 2021-02-08: qty 0.5, 1d supply, fill #0

## 2021-02-08 NOTE — Progress Notes (Signed)
° °  Covid-19 Vaccination Clinic  Name:  Kathleen Martinez    MRN: [id number] DOB: 03-06-1965  02/08/2021  [name] was observed post Covid-19 immunization for 15 minutes without incident. She was provided with Vaccine Information Sheet and instruction to access the V-Safe system.   [name] was instructed to call 911 with any severe reactions post vaccine: Difficulty breathing  Swelling of face and throat  A fast heartbeat  A bad rash all over body  Dizziness and weakness   Immunizations Administered     Name Date Dose VIS Date Route   Moderna Covid-19 vaccine Bivalent Booster 02/08/2021  2:01 PM 0.5 mL 08/26/2020 Intramuscular   Manufacturer: Moderna   Lot: [id number]   NDC: 14782-956-21

## 2021-02-21 ENCOUNTER — Other Ambulatory Visit: Payer: Self-pay

## 2021-02-21 MED ORDER — CARESTART COVID-19 HOME TEST VI KIT
PACK | 0 refills | Status: DC
  Filled 2021-02-21: qty 2, 4d supply, fill #0

## 2021-05-01 ENCOUNTER — Other Ambulatory Visit: Payer: Self-pay

## 2021-05-14 ENCOUNTER — Ambulatory Visit: Payer: 59 | Admitting: Internal Medicine

## 2021-05-14 ENCOUNTER — Encounter: Payer: Self-pay | Admitting: Internal Medicine

## 2021-05-14 ENCOUNTER — Other Ambulatory Visit: Payer: Self-pay

## 2021-05-14 VITALS — BP 114/70 | HR 84 | Temp 97.1°F | Ht 65.0 in | Wt 211.0 lb

## 2021-05-14 DIAGNOSIS — Z0001 Encounter for general adult medical examination with abnormal findings: Secondary | ICD-10-CM | POA: Diagnosis not present

## 2021-05-14 DIAGNOSIS — R5383 Other fatigue: Secondary | ICD-10-CM

## 2021-05-14 DIAGNOSIS — F17200 Nicotine dependence, unspecified, uncomplicated: Secondary | ICD-10-CM | POA: Diagnosis not present

## 2021-05-14 DIAGNOSIS — K219 Gastro-esophageal reflux disease without esophagitis: Secondary | ICD-10-CM | POA: Diagnosis not present

## 2021-05-14 DIAGNOSIS — L0889 Other specified local infections of the skin and subcutaneous tissue: Secondary | ICD-10-CM | POA: Diagnosis not present

## 2021-05-14 DIAGNOSIS — Z6835 Body mass index (BMI) 35.0-35.9, adult: Secondary | ICD-10-CM

## 2021-05-14 MED ORDER — TIZANIDINE HCL 4 MG PO TABS
4.0000 mg | ORAL_TABLET | Freq: Two times a day (BID) | ORAL | 2 refills | Status: DC | PRN
  Filled 2021-05-14: qty 30, 15d supply, fill #0
  Filled 2021-08-03: qty 30, 15d supply, fill #1
  Filled 2021-12-04: qty 30, 15d supply, fill #2

## 2021-05-14 MED ORDER — SEMAGLUTIDE-WEIGHT MANAGEMENT 0.25 MG/0.5ML ~~LOC~~ SOAJ
0.2500 mg | SUBCUTANEOUS | 0 refills | Status: DC
  Filled 2021-05-14 – 2021-05-24 (×2): qty 2, 28d supply, fill #0

## 2021-05-14 MED ORDER — OMEPRAZOLE 20 MG PO CPDR
DELAYED_RELEASE_CAPSULE | Freq: Every day | ORAL | 1 refills | Status: DC
  Filled 2021-05-14 – 2021-08-24 (×2): qty 90, 90d supply, fill #0
  Filled 2021-12-04: qty 90, 90d supply, fill #1

## 2021-05-14 NOTE — Patient Instructions (Signed)

## 2021-05-14 NOTE — Assessment & Plan Note (Signed)
Will trial Wegovy ?Encouraged diet and exercise for weight loss ?

## 2021-05-14 NOTE — Progress Notes (Signed)
? ?Subjective:  ? ? Patient ID: Kathleen Martinez, female    DOB: 1965/04/19, 56 y.o.   MRN: [id number] ? ?HPI ? ?Pt presents to the clinic today for her annual exam. ? ?Flu: 11/2020 ?Tetanus: 09/2017 ?Covid: Moderna x 4 ?Shingrix: never ?Pap smear: 01/2018 ?Mammogram: 05/2020 ?Colon screening: 06/2020 ?Vision screening: as needed ?Dentist: as needed ? ?Diet: She does eat meat. She consumes fruits and veggies. She does eat some fried foods. She drinks mostly water or ginger ale ?Exercise: None ? ?Review of Systems ? ?   ?Past Medical History:  ?Diagnosis Date  ? Chronic constipation   ? Chronic fatigue   ? GERD (gastroesophageal reflux disease)   ? Low serum vitamin D   ? Menopausal state   ? ? ?Current Outpatient Medications  ?Medication Sig Dispense Refill  ? aspirin 81 MG EC tablet Take 1 tablet (81 mg total) by mouth daily. Swallow whole. 30 tablet 12  ? cholecalciferol (VITAMIN D) 1000 units tablet Take 1,000 Units by mouth daily.    ? COVID-19 At Home Antigen Test [facility] COVID-19 HOME TEST) KIT Use as directed 2 kit 0  ? COVID-19 mRNA bivalent vaccine, Moderna, (MODERNA COVID-19 BIVAL BOOSTER) 50 MCG/0.5ML injection Inject into the muscle. 0.5 mL 0  ? nicotine polacrilex (NICORETTE) 4 MG gum USE AS DIRECTED AS NEEDED FOR SMOKING CESSATION 100 each 0  ? omeprazole (PRILOSEC) 20 MG capsule TAKE 1 CAPSULE (20 MG TOTAL) BY MOUTH DAILY. 90 capsule 1  ? tiZANidine (ZANAFLEX) 4 MG tablet Take 1 tablet (4 mg total) by mouth 2 (two) times daily as needed for muscle spasms. 30 tablet 0  ? ?No current facility-administered medications for this visit.  ? ? ?No Known Allergies ? ?Family History  ?Problem Relation Age of Onset  ? Diabetes Mother   ? Hypertension Father   ? Diabetes Sister   ? Diabetes Brother   ? Breast cancer Neg Hx   ? ? ?Social History  ? ?Socioeconomic History  ? Marital status: Married  ?  Spouse name: Not on file  ? Number of children: Not on file  ? Years of education: Not on file  ? Highest education level:  Not on file  ?Occupational History  ? Not on file  ?Tobacco Use  ? Smoking status: Every Day  ?  Packs/day: 0.50  ?  Years: 21.00  ?  Pack years: 10.50  ?  Types: Cigarettes  ? Smokeless tobacco: Never  ?Vaping Use  ? Vaping Use: Never used  ?Substance and Sexual Activity  ? Alcohol use: Yes  ?  Alcohol/week: 0.0 standard drinks  ?  Comment: occasional  ? Drug use: No  ? Sexual activity: Never  ?Other Topics Concern  ? Not on file  ?Social History Narrative  ? Not on file  ? ?Social Determinants of Health  ? ?Financial Resource Strain: Not on file  ?Food Insecurity: Not on file  ?Transportation Needs: Not on file  ?Physical Activity: Not on file  ?Stress: Not on file  ?Social Connections: Not on file  ?Intimate Partner Violence: Not on file  ? ? ? ?Constitutional: Pt reports fatigue. Denies fever, malaise, headache or abrupt weight changes.  ?HEENT: Denies eye pain, eye redness, ear pain, ringing in the ears, wax buildup, runny nose, nasal congestion, bloody nose, or sore throat. ?Respiratory: Denies difficulty breathing, shortness of breath, cough or sputum production.   ?Cardiovascular: Denies chest pain, chest tightness, palpitations or swelling in the hands or feet.  ?Gastrointestinal:  Pt reports intermittent constipation. Denies abdominal pain, bloating, diarrhea or blood in the stool.  ?GU: Denies urgency, frequency, pain with urination, burning sensation, blood in urine, odor or discharge. ?Musculoskeletal: Denies decrease in range of motion, difficulty with gait, muscle pain or joint pain and swelling.  ?Skin: Pt reports rash of feet. Denies lesions or ulcercations.  ?Neurological: Denies dizziness, difficulty with memory, difficulty with speech or problems with balance and coordination.  ?Psych: Denies anxiety, depression, SI/HI. ? ?No other specific complaints in a complete review of systems (except as listed in HPI above). ? ?Objective:  ? Physical Exam ? ?BP 114/70 (BP Location: Left Arm, Patient  Position: Sitting, Cuff Size: Large)   Pulse 84   Temp (!) 97.1 ?F (36.2 ?C) (Temporal)   Ht '5\' 5"'  (1.651 m)   Wt 211 lb (95.7 kg)   SpO2 99%   BMI 35.11 kg/m?  ? ?Wt Readings from Last 3 Encounters:  ?11/14/20 207 lb 9.6 oz (94.2 kg)  ?06/30/20 202 lb (91.6 kg)  ?06/22/20 208 lb (94.3 kg)  ? ? ?General: Appears her stated age,obese, in NAD. ?Skin: Warm, dry and intact. Pitted rash noted of heels of bilateral feet. ?HEENT: Head: normal shape and size; Eyes: sclera white, no icterus, conjunctiva pink, PERRLA and EOMs intact;  ?Neck:  Neck supple, trachea midline. No masses, lumps or thyromegaly present.  ?Cardiovascular: Normal rate and rhythm. S1,S2 noted.  No murmur, rubs or gallops noted. No JVD or BLE edema. No carotid bruits noted. ?Pulmonary/Chest: Normal effort and positive vesicular breath sounds. No respiratory distress. No wheezes, rales or ronchi noted.  ?Abdomen: Soft and nontender. Normal bowel sounds.  ?Musculoskeletal: Strength 5/5 BUE/BLE. No difficulty with gait.  ?Neurological: Alert and oriented. Cranial nerves II-XII grossly intact. Coordination normal.  ?Psychiatric: Mood and affect normal. Behavior is normal. Judgment and thought content normal.  ? ? ? ?BMET ?   ?Component Value Date/Time  ? NA 141 11/14/2020 0834  ? K 4.6 11/14/2020 0834  ? CL 106 11/14/2020 0834  ? CO2 25 11/14/2020 0834  ? GLUCOSE 104 (H) 11/14/2020 0834  ? BUN 15 11/14/2020 0834  ? CREATININE 1.22 (H) 11/14/2020 0834  ? CALCIUM 9.6 11/14/2020 0834  ? GFRNONAA 49 (L) 03/27/2017 0032  ? GFRAA 57 (L) 03/27/2017 0032  ? ? ?Lipid Panel  ?   ?Component Value Date/Time  ? CHOL 191 11/14/2020 0834  ? TRIG 85 11/14/2020 0834  ? HDL 41 (L) 11/14/2020 0834  ? CHOLHDL 4.7 11/14/2020 0834  ? LDLCALC 132 (H) 11/14/2020 0834  ? ? ?CBC ?   ?Component Value Date/Time  ? WBC 6.6 05/11/2020 0911  ? RBC 5.49 (H) 05/11/2020 0911  ? HGB 14.2 05/11/2020 0911  ? HCT 45.3 (H) 05/11/2020 0911  ? PLT 237 05/11/2020 0911  ? MCV 82.5 05/11/2020  0911  ? MCH 25.9 (L) 05/11/2020 0911  ? MCHC 31.3 (L) 05/11/2020 0911  ? RDW 14.7 05/11/2020 0911  ? LYMPHSABS 3.0 03/27/2017 0032  ? MONOABS 0.4 03/27/2017 0032  ? EOSABS 0.2 03/27/2017 0032  ? BASOSABS 0.0 03/27/2017 0032  ? ? ?Hgb A1C ?Lab Results  ?Component Value Date  ? HGBA1C 5.7 (H) 11/14/2020  ? ? ? ? ? ? ?   ?Assessment & Plan:  ? ?Preventative Health Maintenance: ? ?Encouraged her to get a flu shot in the fall ?Tetanus UTD ?Covid vaccine UTD ?Discussed Shingrix vaccine, she will check coverage with her insurance company ?Pap smear UTD ?Mammogram due she will call  to schedule ?Colon screening UTD ?Encouraged her to consume a balanced diet and exercise regimen ?Advised her to see an eye doctor and dentist annually ?Will check CBC, CMET, Lipid, A1C today ? ?Fatigue: ? ?Will check TSH, Vit D, B12 ?Consider sleep study if labs are normal ? ?Pitted Keratolysis: ? ?Referral to podiatry for further evaluation and treatment ? ?RTC in 6 months, follow up chronic conditions ? ?Webb Silversmith, NP ? ?

## 2021-05-15 ENCOUNTER — Encounter: Payer: 59 | Admitting: Family Medicine

## 2021-05-15 LAB — COMPLETE METABOLIC PANEL WITH GFR
AG Ratio: 1.3 (calc) (ref 1.0–2.5)
ALT: 17 U/L (ref 6–29)
AST: 16 U/L (ref 10–35)
Albumin: 4 g/dL (ref 3.6–5.1)
Alkaline phosphatase (APISO): 75 U/L (ref 37–153)
BUN/Creatinine Ratio: 13 (calc) (ref 6–22)
BUN: 17 mg/dL (ref 7–25)
CO2: 24 mmol/L (ref 20–32)
Calcium: 9.4 mg/dL (ref 8.6–10.4)
Chloride: 107 mmol/L (ref 98–110)
Creat: 1.34 mg/dL — ABNORMAL HIGH (ref 0.50–1.03)
Globulin: 3 g/dL (calc) (ref 1.9–3.7)
Glucose, Bld: 116 mg/dL (ref 65–139)
Potassium: 4.2 mmol/L (ref 3.5–5.3)
Sodium: 140 mmol/L (ref 135–146)
Total Bilirubin: 0.3 mg/dL (ref 0.2–1.2)
Total Protein: 7 g/dL (ref 6.1–8.1)
eGFR: 47 mL/min/{1.73_m2} — ABNORMAL LOW (ref >=60)

## 2021-05-15 LAB — CBC
HCT: 41.4 % (ref 35.0–45.0)
Hemoglobin: 13.5 g/dL (ref 11.7–15.5)
MCH: 26.7 pg — ABNORMAL LOW (ref 27.0–33.0)
MCHC: 32.6 g/dL (ref 32.0–36.0)
MCV: 82 fL (ref 80.0–100.0)
MPV: 11.5 fL (ref 7.5–12.5)
Platelets: 244 10*3/uL (ref 140–400)
RBC: 5.05 10*6/uL (ref 3.80–5.10)
RDW: 14.8 % (ref 11.0–15.0)
WBC: 6.8 10*3/uL (ref 3.8–10.8)

## 2021-05-15 LAB — LIPID PANEL
Cholesterol: 186 mg/dL (ref <200)
HDL: 40 mg/dL — ABNORMAL LOW (ref >=50)
LDL Cholesterol (Calc): 124 mg/dL (calc) — ABNORMAL HIGH
Non-HDL Cholesterol (Calc): 146 mg/dL (calc) — ABNORMAL HIGH (ref <130)
Total CHOL/HDL Ratio: 4.7 (calc) (ref <5.0)
Triglycerides: 112 mg/dL (ref <150)

## 2021-05-15 LAB — TSH: TSH: 1.33 mIU/L (ref 0.40–4.50)

## 2021-05-15 LAB — HEMOGLOBIN A1C
Hgb A1c MFr Bld: 5.9 % of total Hgb — ABNORMAL HIGH (ref <5.7)
Mean Plasma Glucose: 123 mg/dL
eAG (mmol/L): 6.8 mmol/L

## 2021-05-15 LAB — VITAMIN D 25 HYDROXY (VIT D DEFICIENCY, FRACTURES): Vit D, 25-Hydroxy: 30 ng/mL (ref 30–100)

## 2021-05-15 LAB — VITAMIN B12: Vitamin B-12: 827 pg/mL (ref 200–1100)

## 2021-05-22 ENCOUNTER — Encounter: Payer: Self-pay | Admitting: Podiatry

## 2021-05-22 ENCOUNTER — Ambulatory Visit: Payer: 59 | Admitting: Podiatry

## 2021-05-22 ENCOUNTER — Other Ambulatory Visit: Payer: Self-pay

## 2021-05-22 DIAGNOSIS — B353 Tinea pedis: Secondary | ICD-10-CM

## 2021-05-22 DIAGNOSIS — L0889 Other specified local infections of the skin and subcutaneous tissue: Secondary | ICD-10-CM

## 2021-05-22 MED ORDER — CLOTRIMAZOLE-BETAMETHASONE 1-0.05 % EX CREA
1.0000 "application " | TOPICAL_CREAM | Freq: Two times a day (BID) | CUTANEOUS | 3 refills | Status: DC
  Filled 2021-05-22: qty 45, 20d supply, fill #0
  Filled 2021-06-04: qty 45, 10d supply, fill #1

## 2021-05-22 MED ORDER — MUPIROCIN 2 % EX OINT
1.0000 | TOPICAL_OINTMENT | Freq: Two times a day (BID) | CUTANEOUS | 3 refills | Status: DC
Start: 2021-05-22 — End: 2021-08-21
  Filled 2021-05-22: qty 22, 15d supply, fill #0
  Filled 2021-06-04: qty 22, 15d supply, fill #1

## 2021-05-22 MED ORDER — TERBINAFINE HCL 250 MG PO TABS
250.0000 mg | ORAL_TABLET | Freq: Every day | ORAL | 0 refills | Status: DC
  Filled 2021-05-22: qty 30, 30d supply, fill #0

## 2021-05-22 NOTE — Progress Notes (Signed)
? ?  HPI: 56 y.o. female presenting today for evaluation of pain and tenderness in the associated to the bilateral feet.  Patient states that she developed Smiddy calluses on the plantar aspect of her feet and she normally digs them out herself.  She has been having pain and tenderness for several months and years now.  She has not done anything for treatment.  She presents for further treatment and evaluation ? ?Past Medical History:  ?Diagnosis Date  ? Chronic constipation   ? Chronic fatigue   ? GERD (gastroesophageal reflux disease)   ? Low serum vitamin D   ? Menopausal state   ? ? ?Past Surgical History:  ?Procedure Laterality Date  ? COLONOSCOPY WITH PROPOFOL N/A 06/30/2020  ? Procedure: COLONOSCOPY WITH PROPOFOL;  Surgeon: Midge Minium, MD;  Location: [facility];  Service: Endoscopy;  Laterality: N/A;  ? TUBAL LIGATION    ? ? ?No Known Allergies ?  ?Physical Exam: ?General: The patient is alert and oriented x3 in no acute distress. ? ?Dermatology: Skin is warm, dry and supple bilateral lower extremities. Negative for open lesions or macerations.  Diffuse hyperkeratosis of skin noted with focal areas of hyperkeratotic lesions with a central nucleated core.  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 ?Vascular: Palpable pedal pulses bilaterally. Capillary refill within normal limits.  Negative for any significant edema or erythema ? ?Neurological: Light touch and protective threshold grossly intact ? ?Musculoskeletal Exam: No pedal deformities noted.  Mild pes planus deformity noted bilateral ? ?Assessment: ?1. 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 ? ?Plan of Care:  ?1. Patient evaluated.  ?2.  I do believe there is a fungal element associated to the patient's symptoms and feet.  I would like to address the fungal element and possible pitted keratolysis ?3.  Prescription for Lamisil 250 mg #30 daily ?4.  Prescription for Lotrisone  cream and mupirocin ointment.  Combined in a 50-50 mixture and apply 2 times daily ?5.  Return to clinic 4 weeks ?  ?  ?Felecia Shelling, DPM ?[facility] ? ?Dr. Felecia Shelling, DPM  ?  ?[address]                                        ?[place], [place] [address]                ?Office [phone]  ?Fax [phone] ? ? ? ? ?

## 2021-05-23 ENCOUNTER — Other Ambulatory Visit: Payer: Self-pay

## 2021-05-23 MED ORDER — COVID-19 AT HOME ANTIGEN TEST VI KIT
PACK | 0 refills | Status: DC
  Filled 2021-05-23: qty 2, 4d supply, fill #0

## 2021-05-24 ENCOUNTER — Other Ambulatory Visit: Payer: Self-pay

## 2021-05-28 ENCOUNTER — Telehealth: Payer: Self-pay

## 2021-05-28 NOTE — Telephone Encounter (Signed)
Copied from CRM [id number]. Topic: Referral - Status ?>> May 28, 2021 10:44 AM Marylen Ponto wrote: ?Reason for CRM: Pt reports that she has not heard back from anyone regarding the sleep apnea test. Pt requests call back. ?

## 2021-05-31 ENCOUNTER — Other Ambulatory Visit: Payer: Self-pay

## 2021-05-31 MED ORDER — NICOTINE POLACRILEX 4 MG MT GUM
CHEWING_GUM | OROMUCOSAL | 0 refills | Status: DC
  Filled 2021-05-31: qty 110, 15d supply, fill #0

## 2021-06-04 ENCOUNTER — Other Ambulatory Visit: Payer: Self-pay

## 2021-06-06 ENCOUNTER — Other Ambulatory Visit: Payer: Self-pay

## 2021-06-07 ENCOUNTER — Other Ambulatory Visit: Payer: Self-pay

## 2021-06-18 ENCOUNTER — Other Ambulatory Visit: Payer: Self-pay | Admitting: Internal Medicine

## 2021-06-18 ENCOUNTER — Other Ambulatory Visit: Payer: Self-pay

## 2021-06-19 ENCOUNTER — Other Ambulatory Visit: Payer: Self-pay

## 2021-06-19 ENCOUNTER — Ambulatory Visit: Payer: 59 | Admitting: Podiatry

## 2021-06-19 DIAGNOSIS — L0889 Other specified local infections of the skin and subcutaneous tissue: Secondary | ICD-10-CM

## 2021-06-19 MED ORDER — WEGOVY 0.25 MG/0.5ML ~~LOC~~ SOAJ
0.2500 mg | SUBCUTANEOUS | 0 refills | Status: DC
  Filled 2021-06-19 (×2): qty 2, 28d supply, fill #0

## 2021-06-19 NOTE — Progress Notes (Signed)
   HPI: 56 y.o. female presenting today for follow-up evaluation of symptomatic calluses and tinea pedis to the bilateral feet.  Patient states that she has been taking the Lamisil daily.  She has also been combining the paracent and Lotrisone ointment and a 50-50 mixture and applying 2 times daily to the feet.  No new complaints this time  Past Medical History:  Diagnosis Date   Chronic constipation    Chronic fatigue    GERD (gastroesophageal reflux disease)    Low serum vitamin D    Menopausal state     Past Surgical History:  Procedure Laterality Date   COLONOSCOPY WITH PROPOFOL N/A 06/30/2020   Procedure: COLONOSCOPY WITH PROPOFOL;  Surgeon: Lucilla Lame, MD;  Location: [facility];  Service: Endoscopy;  Laterality: N/A;   TUBAL LIGATION      No Known Allergies   Physical Exam: General: The patient is alert and oriented x3 in no acute distress.  Dermatology: Skin is warm, dry and supple bilateral lower extremities. Negative for open lesions or macerations.  Diffuse hyperkeratosis of skin noted with focal areas of hyperkeratotic lesions with a central nucleated core.  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 Capillary refill within normal limits.  Negative for any significant edema or erythema  Neurological: Light touch and protective threshold grossly intact  Musculoskeletal Exam: No pedal deformities noted.  Mild pes planus deformity noted bilateral  Assessment: 1. 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 Patient evaluated.  2.  Continue oral Lamisil to 50 mg #30 daily until completed 3.  Continue 50-50 mixture of Lotrisone cream with mupirocin ointment 2 times daily 4.  Excisional debridement of some of the hyperkeratotic porokeratosis lesions was performed today using a 312 scalpel without incident or bleeding. 5.  Urea 40% lotion  was also dispensed.  Apply daily 6.  Return to clinic 2 months     Edrick Kins, DPM [facility]  Dr. Edrick Kins, DPM    [address], [place] [address]                Office [phone]  Fax [phone]

## 2021-06-19 NOTE — Telephone Encounter (Signed)
Requested medication (s) are due for refill today:   Yes  Requested medication (s) are on the active medication list:   Yes  Future visit scheduled:   Yes in 1 mo.   Last ordered: 05/14/2021 2 ml, 0 refills  Returned because courtesy refill already given in May.   Provider to review for refills prior to appt.   Requested Prescriptions  Pending Prescriptions Disp Refills   Semaglutide-Weight Management (WEGOVY) 0.25 MG/0.5ML SOAJ 2 mL 0    Sig: Inject 0.25 mg into the skin once a week.     Endocrinology:  Diabetes - GLP-1 Receptor Agonists - semaglutide Failed - 06/18/2021  3:50 PM      Failed - HBA1C in normal range and within 180 days    Hgb A1c MFr Bld  Date Value Ref Range Status  05/14/2021 5.9 (H) <5.7 % of total Hgb Final    Comment:    For someone without known diabetes, a hemoglobin  A1c value between 5.7% and 6.4% is consistent with prediabetes and should be confirmed with a  follow-up test. . For someone with known diabetes, a value <7% indicates that their diabetes is well controlled. A1c targets should be individualized based on duration of diabetes, age, comorbid conditions, and other considerations. . This assay result is consistent with an increased risk of diabetes. . Currently, no consensus exists regarding use of hemoglobin A1c for diagnosis of diabetes for children. .          Failed - Cr in normal range and within 360 days    Creat  Date Value Ref Range Status  05/14/2021 1.34 (H) 0.50 - 1.03 mg/dL Final         Passed - Valid encounter within last 6 months    Recent Outpatient Visits           1 month ago Smoker   [facility] [place], Salvadore Oxford, NP   7 months ago Need for immunization against influenza   [facility] [place], Salvadore Oxford, NP   1 year ago Encounter for general adult medical examination with abnormal findings   [facility] [place], Salvadore Oxford, NP   1 year ago Encounter to establish care with  new doctor   [facility], Jodelle Gross, FNP   6 years ago Bronchitis, acute, with bronchospasm   [facility] Edwena Felty, MD       Future Appointments             In 1 month Baity, Salvadore Oxford, NP [facility], [facility]

## 2021-08-01 ENCOUNTER — Other Ambulatory Visit: Payer: Self-pay

## 2021-08-01 ENCOUNTER — Other Ambulatory Visit: Payer: Self-pay | Admitting: Internal Medicine

## 2021-08-02 ENCOUNTER — Other Ambulatory Visit: Payer: Self-pay

## 2021-08-02 MED ORDER — WEGOVY 0.25 MG/0.5ML ~~LOC~~ SOAJ
0.2500 mg | SUBCUTANEOUS | 0 refills | Status: DC
  Filled 2021-08-02: qty 2, 28d supply, fill #0

## 2021-08-02 NOTE — Telephone Encounter (Signed)
Requested Prescriptions  Pending Prescriptions Disp Refills  . Semaglutide-Weight Management (WEGOVY) 0.25 MG/0.5ML SOAJ 2 mL 0    Sig: Inject 0.25 mg into the skin once a week.     Endocrinology:  Diabetes - GLP-1 Receptor Agonists - semaglutide Failed - 08/01/2021  5:28 PM      Failed - HBA1C in normal range and within 180 days    Hgb A1c MFr Bld  Date Value Ref Range Status  05/14/2021 5.9 (H) <5.7 % of total Hgb Final    Comment:    For someone without known diabetes, a hemoglobin  A1c value between 5.7% and 6.4% is consistent with prediabetes and should be confirmed with a  follow-up test. . For someone with known diabetes, a value <7% indicates that their diabetes is well controlled. A1c targets should be individualized based on duration of diabetes, age, comorbid conditions, and other considerations. . This assay result is consistent with an increased risk of diabetes. . Currently, no consensus exists regarding use of hemoglobin A1c for diagnosis of diabetes for children. .          Failed - Cr in normal range and within 360 days    Creat  Date Value Ref Range Status  05/14/2021 1.34 (H) 0.50 - 1.03 mg/dL Final         Passed - Valid encounter within last 6 months    Recent Outpatient Visits          2 months ago Smoker   [facility] [place], Salvadore Oxford, NP   8 months ago Need for immunization against influenza   [facility] [place], Salvadore Oxford, NP   1 year ago Encounter for general adult medical examination with abnormal findings   [facility] [place], Salvadore Oxford, NP   1 year ago Encounter to establish care with new doctor   [facility], Jodelle Gross, FNP   6 years ago Bronchitis, acute, with bronchospasm   [facility] Edwena Felty, MD      Future Appointments            In 1 week Baity, Salvadore Oxford, NP [facility]

## 2021-08-03 ENCOUNTER — Other Ambulatory Visit: Payer: Self-pay

## 2021-08-08 ENCOUNTER — Other Ambulatory Visit: Payer: Self-pay

## 2021-08-14 ENCOUNTER — Other Ambulatory Visit: Payer: Self-pay

## 2021-08-14 ENCOUNTER — Encounter: Payer: Self-pay | Admitting: Internal Medicine

## 2021-08-14 ENCOUNTER — Ambulatory Visit: Payer: 59 | Admitting: Internal Medicine

## 2021-08-14 DIAGNOSIS — R7303 Prediabetes: Secondary | ICD-10-CM | POA: Diagnosis not present

## 2021-08-14 DIAGNOSIS — I700 Atherosclerosis of aorta: Secondary | ICD-10-CM

## 2021-08-14 DIAGNOSIS — E6609 Other obesity due to excess calories: Secondary | ICD-10-CM

## 2021-08-14 DIAGNOSIS — Z6834 Body mass index (BMI) 34.0-34.9, adult: Secondary | ICD-10-CM | POA: Diagnosis not present

## 2021-08-14 DIAGNOSIS — E7800 Pure hypercholesterolemia, unspecified: Secondary | ICD-10-CM | POA: Diagnosis not present

## 2021-08-14 MED ORDER — SEMAGLUTIDE-WEIGHT MANAGEMENT 0.5 MG/0.5ML ~~LOC~~ SOAJ
0.5000 mg | SUBCUTANEOUS | 0 refills | Status: DC
  Filled 2021-08-14: qty 2, 28d supply, fill #0

## 2021-08-14 NOTE — Patient Instructions (Signed)
Calorie Counting for Weight Loss Calories are units of energy. Your body needs a certain number of calories from food to keep going throughout the day. When you eat or drink more calories than your body needs, your body stores the extra calories mostly as fat. When you eat or drink fewer calories than your body needs, your body burns fat to get the energy it needs. Calorie counting means keeping track of how many calories you eat and drink each day. Calorie counting can be helpful if you need to lose weight. If you eat fewer calories than your body needs, you should lose weight. Ask your health care provider what a healthy weight is for you. For calorie counting to work, you will need to eat the right number of calories each day to lose a healthy amount of weight per week. A dietitian can help you figure out how many calories you need in a day and will suggest ways to reach your calorie goal. A healthy amount of weight to lose each week is usually 1-2 lb (0.5-0.9 kg). This usually means that your daily calorie intake should be reduced by 500-750 calories. Eating 1,200-1,500 calories a day can help most women lose weight. Eating 1,500-1,800 calories a day can help most men lose weight. What do I need to know about calorie counting? Work with your health care provider or dietitian to determine how many calories you should get each day. To meet your daily calorie goal, you will need to: Find out how many calories are in each food that you would like to eat. Try to do this before you eat. Decide how much of the food you plan to eat. Keep a food log. Do this by writing down what you ate and how many calories it had. To successfully lose weight, it is important to balance calorie counting with a healthy lifestyle that includes regular activity. Where do I find calorie information?  The number of calories in a food can be found on a Nutrition Facts label. If a food does not have a Nutrition Facts label, try  to look up the calories online or ask your dietitian for help. Remember that calories are listed per serving. If you choose to have more than one serving of a food, you will have to multiply the calories per serving by the number of servings you plan to eat. For example, the label on a package of bread might say that a serving size is 1 slice and that there are 90 calories in a serving. If you eat 1 slice, you will have eaten 90 calories. If you eat 2 slices, you will have eaten 180 calories. How do I keep a food log? After each time that you eat, record the following in your food log as soon as possible: What you ate. Be sure to include toppings, sauces, and other extras on the food. How much you ate. This can be measured in cups, ounces, or number of items. How many calories were in each food and drink. The total number of calories in the food you ate. Keep your food log near you, such as in a pocket-sized notebook or on an app or website on your mobile phone. Some programs will calculate calories for you and show you how many calories you have left to meet your daily goal. What are some portion-control tips? Know how many calories are in a serving. This will help you know how many servings you can have of a certain   food. Use a measuring cup to measure serving sizes. You could also try weighing out portions on a kitchen scale. With time, you will be able to estimate serving sizes for some foods. Take time to put servings of different foods on your favorite plates or in your favorite bowls and cups so you know what a serving looks like. Try not to eat straight from a food's packaging, such as from a bag or box. Eating straight from the package makes it hard to see how much you are eating and can lead to overeating. Put the amount you would like to eat in a cup or on a plate to make sure you are eating the right portion. Use smaller plates, glasses, and bowls for smaller portions and to prevent  overeating. Try not to multitask. For example, avoid watching TV or using your computer while eating. If it is time to eat, sit down at a table and enjoy your food. This will help you recognize when you are full. It will also help you be more mindful of what and how much you are eating. What are tips for following this plan? Reading food labels Check the calorie count compared with the serving size. The serving size may be smaller than what you are used to eating. Check the source of the calories. Try to choose foods that are high in protein, fiber, and vitamins, and low in saturated fat, trans fat, and sodium. Shopping Read nutrition labels while you shop. This will help you make healthy decisions about which foods to buy. Pay attention to nutrition labels for low-fat or fat-free foods. These foods sometimes have the same number of calories or more calories than the full-fat versions. They also often have added sugar, starch, or salt to make up for flavor that was removed with the fat. Make a grocery list of lower-calorie foods and stick to it. Cooking Try to cook your favorite foods in a healthier way. For example, try baking instead of frying. Use low-fat dairy products. Meal planning Use more fruits and vegetables. One-half of your plate should be fruits and vegetables. Include lean proteins, such as chicken, turkey, and fish. Lifestyle Each week, aim to do one of the following: 150 minutes of moderate exercise, such as walking. 75 minutes of vigorous exercise, such as running. General information Know how many calories are in the foods you eat most often. This will help you calculate calorie counts faster. Find a way of tracking calories that works for you. Get creative. Try different apps or programs if writing down calories does not work for you. What foods should I eat?  Eat nutritious foods. It is better to have a nutritious, high-calorie food, such as an avocado, than a food with  few nutrients, such as a bag of potato chips. Use your calories on foods and drinks that will fill you up and will not leave you hungry soon after eating. Examples of foods that fill you up are nuts and nut butters, vegetables, lean proteins, and high-fiber foods such as whole grains. High-fiber foods are foods with more than 5 g of fiber per serving. Pay attention to calories in drinks. Low-calorie drinks include water and unsweetened drinks. The items listed above may not be a complete list of foods and beverages you can eat. Contact a dietitian for more information. What foods should I limit? Limit foods or drinks that are not good sources of vitamins, minerals, or protein or that are high in unhealthy fats. These   include: Candy. Other sweets. Sodas, specialty coffee drinks, alcohol, and juice. The items listed above may not be a complete list of foods and beverages you should avoid. Contact a dietitian for more information. How do I count calories when eating out? Pay attention to portions. Often, portions are much larger when eating out. Try these tips to keep portions smaller: Consider sharing a meal instead of getting your own. If you get your own meal, eat only half of it. Before you start eating, ask for a container and put half of your meal into it. When available, consider ordering smaller portions from the menu instead of full portions. Pay attention to your food and drink choices. Knowing the way food is cooked and what is included with the meal can help you eat fewer calories. If calories are listed on the menu, choose the lower-calorie options. Choose dishes that include vegetables, fruits, whole grains, low-fat dairy products, and lean proteins. Choose items that are boiled, broiled, grilled, or steamed. Avoid items that are buttered, battered, fried, or served with cream sauce. Items labeled as crispy are usually fried, unless stated otherwise. Choose water, low-fat milk,  unsweetened iced tea, or other drinks without added sugar. If you want an alcoholic beverage, choose a lower-calorie option, such as a glass of wine or light beer. Ask for dressings, sauces, and syrups on the side. These are usually high in calories, so you should limit the amount you eat. If you want a salad, choose a garden salad and ask for grilled meats. Avoid extra toppings such as bacon, cheese, or fried items. Ask for the dressing on the side, or ask for olive oil and vinegar or lemon to use as dressing. Estimate how many servings of a food you are given. Knowing serving sizes will help you be aware of how much food you are eating at restaurants. Where to find more information Centers for Disease Control and Prevention: www.cdc.gov U.[place]. Department of Agriculture: myplate.gov Summary Calorie counting means keeping track of how many calories you eat and drink each day. If you eat fewer calories than your body needs, you should lose weight. A healthy amount of weight to lose per week is usually 1-2 lb (0.5-0.9 kg). This usually means reducing your daily calorie intake by 500-750 calories. The number of calories in a food can be found on a Nutrition Facts label. If a food does not have a Nutrition Facts label, try to look up the calories online or ask your dietitian for help. Use smaller plates, glasses, and bowls for smaller portions and to prevent overeating. Use your calories on foods and drinks that will fill you up and not leave you hungry shortly after a meal. This information is not intended to replace advice given to you by your health care provider. Make sure you discuss any questions you have with your health care provider. Document Revised: 02/11/2019 Document Reviewed: 02/11/2019 Elsevier Patient Education  2023 Elsevier Inc.  

## 2021-08-14 NOTE — Progress Notes (Signed)
Subjective:    Patient ID: Kathleen Martinez, female    DOB: 04/03/1965, 56 y.o.   MRN: [id number]  HPI  Pt presents to the clinic today for 2 month follow up for weight check. At her last visit, she was started on Wegovy.  She denies nausea, vomiting, bloating or constipation.  Her starting weight was 211 lbs with a BMI of 35.11. Her weight today is 206 lbs with a BMI of 34.28.  She reports she has not made any changes to her diet and she is not exercising.  She has a history of HLD with aortic atherosclerosis and prediabetes.  Review of Systems  Past Medical History:  Diagnosis Date   Chronic constipation    Chronic fatigue    GERD (gastroesophageal reflux disease)    Low serum vitamin D    Menopausal state     Current Outpatient Medications  Medication Sig Dispense Refill   aspirin 81 MG EC tablet Take 1 tablet (81 mg total) by mouth daily. Swallow whole. 30 tablet 12   cholecalciferol (VITAMIN D) 1000 units tablet Take 1,000 Units by mouth daily.     clotrimazole-betamethasone (LOTRISONE) cream Apply 1 application. topically 2 (two) times daily. 45 g 3   COVID-19 At Home Antigen Test (CARESTART COVID-19 HOME TEST) KIT Use as directed 2 kit 0   mupirocin ointment (BACTROBAN) 2 % Apply 1 application. topically 2 (two) times daily. 22 g 3   nicotine polacrilex (NICORETTE) 4 MG gum USE AS DIRECTED AS NEEDED FOR SMOKING CESSATION 100 each 0   nicotine polacrilex (SM NICOTINE POLACRILEX) 4 MG gum Take as directed 110 each 0   omeprazole (PRILOSEC) 20 MG capsule TAKE 1 CAPSULE (20 MG TOTAL) BY MOUTH DAILY. 90 capsule 1   Semaglutide-Weight Management (WEGOVY) 0.25 MG/0.5ML SOAJ Inject 0.25 mg into the skin once a week. 2 mL 0   terbinafine (LAMISIL) 250 MG tablet Take 1 tablet (250 mg total) by mouth daily. 30 tablet 0   tiZANidine (ZANAFLEX) 4 MG tablet Take 1 tablet (4 mg total) by mouth 2 (two) times daily as needed for muscle spasms. 30 tablet 2   No current facility-administered  medications for this visit.    No Known Allergies  Family History  Problem Relation Age of Onset   Diabetes Mother    Hypertension Father    Diabetes Father    Diabetes Sister    Diabetes Brother    Breast cancer Neg Hx     Social History   Socioeconomic History   Marital status: Married    Spouse name: Not on file   Number of children: Not on file   Years of education: Not on file   Highest education level: Not on file  Occupational History   Not on file  Tobacco Use   Smoking status: Every Day    Packs/day: 0.50    Years: 21.00    Total pack years: 10.50    Types: Cigarettes   Smokeless tobacco: Never  Vaping Use   Vaping Use: Never used  Substance and Sexual Activity   Alcohol use: Yes    Alcohol/week: 0.0 standard drinks of alcohol    Comment: occasional   Drug use: No   Sexual activity: Never  Other Topics Concern   Not on file  Social History Narrative   Not on file   Social Determinants of Health   Financial Resource Strain: Not on file  Food Insecurity: Not on file  Transportation Needs: Not on  file  Physical Activity: Not on file  Stress: Not on file  Social Connections: Not on file  Intimate Partner Violence: Not on file     Constitutional: Denies fever, malaise, fatigue, headache or abrupt weight changes.  Respiratory: Denies difficulty breathing, shortness of breath, cough or sputum production.   Cardiovascular: Denies chest pain, chest tightness, palpitations or swelling in the hands or feet.  Gastrointestinal: Denies abdominal pain, bloating, constipation, diarrhea or blood in the stool.  No other specific complaints in a complete review of systems (except as listed in HPI above).     Objective:   Physical Exam  BP 124/82 (BP Location: Left Arm, Patient Position: Sitting, Cuff Size: Normal)   Pulse 81   Temp (!) 96.9 F (36.1 C) (Temporal)   Ht _0  (1.651 m)   Wt 206 lb (93.4 kg)   SpO2 98%   BMI 34.28 kg/m   Wt Readings  from Last 3 Encounters:  05/14/21 211 lb (95.7 kg)  11/14/20 207 lb 9.6 oz (94.2 kg)  06/30/20 202 lb (91.6 kg)    General: Appears her stated age, obese, in NAD. Cardiovascular: Normal rate and rhythm. S1,S2 noted.  No murmur, rubs or gallops noted.  Pulmonary/Chest: Normal effort and positive vesicular breath sounds. No respiratory distress. No wheezes, rales or ronchi noted.  Abdomen: Normal bowel sounds. Neurological: Alert and oriented.   BMET    Component Value Date/Time   NA 140 05/14/2021 1554   K 4.2 05/14/2021 1554   CL 107 05/14/2021 1554   CO2 24 05/14/2021 1554   GLUCOSE 116 05/14/2021 1554   BUN 17 05/14/2021 1554   CREATININE 1.34 (H) 05/14/2021 1554   CALCIUM 9.4 05/14/2021 1554   GFRNONAA 49 (L) 03/27/2017 0032   GFRAA 57 (L) 03/27/2017 0032    Lipid Panel     Component Value Date/Time   CHOL 186 05/14/2021 1554   TRIG 112 05/14/2021 1554   HDL 40 (L) 05/14/2021 1554   CHOLHDL 4.7 05/14/2021 1554   LDLCALC 124 (H) 05/14/2021 1554    CBC    Component Value Date/Time   WBC 6.8 05/14/2021 1554   RBC 5.05 05/14/2021 1554   HGB 13.5 05/14/2021 1554   HCT 41.4 05/14/2021 1554   PLT 244 05/14/2021 1554   MCV 82.0 05/14/2021 1554   MCH 26.7 (L) 05/14/2021 1554   MCHC 32.6 05/14/2021 1554   RDW 14.8 05/14/2021 1554   LYMPHSABS 3.0 03/27/2017 0032   MONOABS 0.4 03/27/2017 0032   EOSABS 0.2 03/27/2017 0032   BASOSABS 0.0 03/27/2017 0032    Hgb A1C Lab Results  Component Value Date   HGBA1C 5.9 (H) 05/14/2021           Assessment & Plan:     RTC in 4 months for your annual exam Webb Silversmith, NP

## 2021-08-14 NOTE — Assessment & Plan Note (Signed)
Encourage low-carb, low saturated fat diet and exercise for weight loss 

## 2021-08-14 NOTE — Assessment & Plan Note (Signed)
We will increase Wegovy to 0.5 mg weekly for the next 4 weeks Encouraged diet consisting of lean meats and green vegetables mainly Encouraged aerobic exercise for at least 90 minutes weekly

## 2021-08-21 ENCOUNTER — Ambulatory Visit: Payer: 59 | Admitting: Podiatry

## 2021-08-21 ENCOUNTER — Other Ambulatory Visit: Payer: Self-pay

## 2021-08-21 DIAGNOSIS — B353 Tinea pedis: Secondary | ICD-10-CM | POA: Diagnosis not present

## 2021-08-21 MED ORDER — MUPIROCIN 2 % EX OINT
1.0000 | TOPICAL_OINTMENT | Freq: Two times a day (BID) | CUTANEOUS | 3 refills | Status: DC
  Filled 2021-08-21: qty 22, 7d supply, fill #0
  Filled 2021-11-06: qty 22, 7d supply, fill #1
  Filled 2021-12-04: qty 22, 7d supply, fill #2

## 2021-08-21 MED ORDER — CLOTRIMAZOLE-BETAMETHASONE 1-0.05 % EX CREA
1.0000 | TOPICAL_CREAM | Freq: Two times a day (BID) | CUTANEOUS | 3 refills | Status: DC
  Filled 2021-08-21: qty 45, 20d supply, fill #0
  Filled 2021-11-06: qty 45, 20d supply, fill #1
  Filled 2021-12-04: qty 45, 20d supply, fill #2

## 2021-08-21 NOTE — Progress Notes (Signed)
   HPI: 56 y.o. female presenting today for follow-up evaluation of symptomatic calluses and tinea pedis to the bilateral feet.  Patient states that when she spends the time to apply the lotion this is helped significantly.  She does get busy and it is hard to find time to apply the lotion to her feet however.  Presenting for further treatment evaluation  Past Medical History:  Diagnosis Date   Chronic constipation    Chronic fatigue    GERD (gastroesophageal reflux disease)    Low serum vitamin D    Menopausal state     Past Surgical History:  Procedure Laterality Date   COLONOSCOPY WITH PROPOFOL N/A 06/30/2020   Procedure: COLONOSCOPY WITH PROPOFOL;  Surgeon: Midge Minium, MD;  Location: [facility];  Service: Endoscopy;  Laterality: N/A;   TUBAL LIGATION      No Known Allergies   Physical Exam: General: The patient is alert and oriented x3 in no acute distress.  Dermatology: Skin is warm, dry and supple bilateral lower extremities. Negative for open lesions or macerations.  There continues to be some diffuse hyperkeratosis of skin noted with focal areas of hyperkeratotic lesions with a central nucleated core although this has significantly improved since last visit.  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 Capillary refill within normal limits.  Negative for any significant edema or erythema  Neurological: Light touch and protective threshold grossly intact  Musculoskeletal Exam: No pedal deformities noted.  Mild pes planus deformity noted bilateral  Assessment: 1. 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 Patient evaluated.  2.  Continue 50-50 mixture of Lotrisone cream with mupirocin ointment 2 times daily.  Refills provided 3.  Continue urea 40% lotion which was also dispensed last visit.  Apply daily 4.  Return to clinic as needed      Felecia Shelling, DPM [facility]  Dr. Felecia Shelling, DPM    [address] [place], [place] [address]                Office [phone]  Fax [phone]

## 2021-08-24 ENCOUNTER — Other Ambulatory Visit: Payer: Self-pay

## 2021-09-24 ENCOUNTER — Other Ambulatory Visit: Payer: Self-pay | Admitting: Internal Medicine

## 2021-09-25 ENCOUNTER — Other Ambulatory Visit: Payer: Self-pay

## 2021-09-26 ENCOUNTER — Other Ambulatory Visit: Payer: Self-pay

## 2021-09-26 MED ORDER — WEGOVY 0.5 MG/0.5ML ~~LOC~~ SOAJ
0.5000 mg | SUBCUTANEOUS | 0 refills | Status: DC
  Filled 2021-09-26: qty 2, 28d supply, fill #0

## 2021-09-26 NOTE — Telephone Encounter (Signed)
Requested medication (s) are due for refill today:   Yes  Requested medication (s) are on the active medication list:   Yes  Future visit scheduled:   Yes   Last ordered: 08/14/2021 2 ml, 0 refills  Returned because a new rx is needed.   No refills remain.    Requested Prescriptions  Pending Prescriptions Disp Refills   Semaglutide-Weight Management (WEGOVY) 0.5 MG/0.5ML SOAJ 2 mL 0    Sig: Inject 0.5 mg into the skin once a week.     Endocrinology:  Diabetes - GLP-1 Receptor Agonists - semaglutide Failed - 09/24/2021  3:46 PM      Failed - HBA1C in normal range and within 180 days    Hgb A1c MFr Bld  Date Value Ref Range Status  05/14/2021 5.9 (H) <5.7 % of total Hgb Final    Comment:    For someone without known diabetes, a hemoglobin  A1c value between 5.7% and 6.4% is consistent with prediabetes and should be confirmed with a  follow-up test. . For someone with known diabetes, a value <7% indicates that their diabetes is well controlled. A1c targets should be individualized based on duration of diabetes, age, comorbid conditions, and other considerations. . This assay result is consistent with an increased risk of diabetes. . Currently, no consensus exists regarding use of hemoglobin A1c for diagnosis of diabetes for children. .          Failed - Cr in normal range and within 360 days    Creat  Date Value Ref Range Status  05/14/2021 1.34 (H) 0.50 - 1.03 mg/dL Final         Passed - Valid encounter within last 6 months    Recent Outpatient Visits           1 month ago Class 1 obesity due to excess calories with serious comorbidity and body mass index (BMI) of 34.0 to 34.9 in adult   [facility] [place], Salvadore Oxford, NP   4 months ago Smoker   [facility] [place], Salvadore Oxford, NP   10 months ago Need for immunization against influenza   [facility] [place], Salvadore Oxford, NP   1 year ago Encounter for general adult medical  examination with abnormal findings   [facility], Salvadore Oxford, NP   1 year ago Encounter to establish care with new doctor   [facility], Jodelle Gross, FNP       Future Appointments             In 3 months Baity, Salvadore Oxford, NP [facility]

## 2021-11-06 ENCOUNTER — Other Ambulatory Visit: Payer: Self-pay

## 2021-11-19 ENCOUNTER — Encounter: Payer: Self-pay | Admitting: Internal Medicine

## 2021-11-19 ENCOUNTER — Telehealth (INDEPENDENT_AMBULATORY_CARE_PROVIDER_SITE_OTHER): Payer: 59 | Admitting: Internal Medicine

## 2021-11-19 DIAGNOSIS — J00 Acute nasopharyngitis [common cold]: Secondary | ICD-10-CM

## 2021-11-19 MED ORDER — AZITHROMYCIN 250 MG PO TABS
ORAL_TABLET | ORAL | 0 refills | Status: DC
Start: 2021-11-19 — End: 2021-12-25

## 2021-11-19 MED ORDER — PREDNISONE 10 MG PO TABS
ORAL_TABLET | ORAL | 0 refills | Status: DC
Start: 2021-11-19 — End: 2021-12-25

## 2021-11-19 NOTE — Patient Instructions (Signed)

## 2021-11-19 NOTE — Progress Notes (Signed)
Virtual Visit via Video Note  I connected with Kathleen Martinez on 11/19/21 at  4:00 PM EST by a video enabled telemedicine application and verified that I am speaking with the correct person using two identifiers.  Location: Patient: Home Provider: Office  Person's participating in this video call: Webb Silversmith, NP and Kathleen Martinez   I discussed the limitations of evaluation and management by telemedicine and the availability of in person appointments. The patient expressed understanding and agreed to proceed.  History of Present Illness:  Pt reports fatigue, sneezing, runny nose and cough. This started 4 days ago. She is blowing clear mucous out of her nose. The cough is mostly non productive but she feels a lot of congestion in her chest.  She denies headache, nasal congestion, ear pain, sore throat, shortness of breath, chest pain, nausea, vomiting or diarrhea.  She denies fever, chills or body aches but reports she feels awful.  She has been taking Mucinex DM with minimal relief of symptoms.  She has not had sick contacts that she is aware of.  She she has not taken a home COVID test but plans to so today.   Past Medical History:  Diagnosis Date   Chronic constipation    Chronic fatigue    GERD (gastroesophageal reflux disease)    Low serum vitamin D    Menopausal state     Current Outpatient Medications  Medication Sig Dispense Refill   aspirin 81 MG EC tablet Take 1 tablet (81 mg total) by mouth daily. Swallow whole. 30 tablet 12   cholecalciferol (VITAMIN D) 1000 units tablet Take 1,000 Units by mouth daily.     clotrimazole-betamethasone (LOTRISONE) cream Apply 1 Application topically 2 (two) times daily. 45 g 3   COVID-19 At Home Antigen Test (CARESTART COVID-19 HOME TEST) KIT Use as directed 2 kit 0   mupirocin ointment (BACTROBAN) 2 % Apply 1 Application topically 2 (two) times daily. 22 g 3   omeprazole (PRILOSEC) 20 MG capsule TAKE 1 CAPSULE (20 MG TOTAL) BY MOUTH DAILY. 90  capsule 1   Semaglutide-Weight Management (WEGOVY) 0.5 MG/0.5ML SOAJ Inject 0.5 mg into the skin once a week. 6 mL 0   terbinafine (LAMISIL) 250 MG tablet Take 1 tablet (250 mg total) by mouth daily. 30 tablet 0   tiZANidine (ZANAFLEX) 4 MG tablet Take 1 tablet (4 mg total) by mouth 2 (two) times daily as needed for muscle spasms. 30 tablet 2   No current facility-administered medications for this visit.    No Known Allergies  Family History  Problem Relation Age of Onset   Diabetes Mother    Hypertension Father    Diabetes Father    Diabetes Sister    Diabetes Brother    Breast cancer Neg Hx     Social History   Socioeconomic History   Marital status: Married    Spouse name: Not on file   Number of children: Not on file   Years of education: Not on file   Highest education level: Not on file  Occupational History   Not on file  Tobacco Use   Smoking status: Every Day    Packs/day: 0.50    Years: 21.00    Total pack years: 10.50    Types: Cigarettes   Smokeless tobacco: Never  Vaping Use   Vaping Use: Never used  Substance and Sexual Activity   Alcohol use: Yes    Alcohol/week: 0.0 standard drinks of alcohol    Comment: occasional  Drug use: No   Sexual activity: Never  Other Topics Concern   Not on file  Social History Narrative   Not on file   Social Determinants of Health   Financial Resource Strain: Not on file  Food Insecurity: Not on file  Transportation Needs: Not on file  Physical Activity: Not on file  Stress: Not on file  Social Connections: Not on file  Intimate Partner Violence: Not on file     Constitutional: Pt reports fatigue. Denies fever, malaise, headache or abrupt weight changes.  HEENT: Patient reports sneezing and runny nose.  Denies eye pain, eye redness, ear pain, ringing in the ears, wax buildup, nasal congestion, bloody nose, or sore throat. Respiratory: Pt reports cough. Denies difficulty breathing, shortness of breath, or  sputum production.   Cardiovascular: Denies chest pain, chest tightness, palpitations or swelling in the hands or feet.  Gastrointestinal: Denies abdominal pain, bloating, constipation, diarrhea or blood in the stool.   No other specific complaints in a complete review of systems (except as listed in HPI above).  Observations/Objective:   Wt Readings from Last 3 Encounters:  08/14/21 206 lb (93.4 kg)  05/14/21 211 lb (95.7 kg)  11/14/20 207 lb 9.6 oz (94.2 kg)    General: Appears her stated age, appears unwell but in NAD. HEENT: Head: normal shape and size; Nose: Slight congestion noted; Throat/Mouth: Hoarseness noted Pulmonary/Chest: Normal effort.  Dry cough noted.  No respiratory distress.  Neurological: Alert and oriented.   BMET    Component Value Date/Time   NA 140 05/14/2021 1554   K 4.2 05/14/2021 1554   CL 107 05/14/2021 1554   CO2 24 05/14/2021 1554   GLUCOSE 116 05/14/2021 1554   BUN 17 05/14/2021 1554   CREATININE 1.34 (H) 05/14/2021 1554   CALCIUM 9.4 05/14/2021 1554   GFRNONAA 49 (L) 03/27/2017 0032   GFRAA 57 (L) 03/27/2017 0032    Lipid Panel     Component Value Date/Time   CHOL 186 05/14/2021 1554   TRIG 112 05/14/2021 1554   HDL 40 (L) 05/14/2021 1554   CHOLHDL 4.7 05/14/2021 1554   LDLCALC 124 (H) 05/14/2021 1554    CBC    Component Value Date/Time   WBC 6.8 05/14/2021 1554   RBC 5.05 05/14/2021 1554   HGB 13.5 05/14/2021 1554   HCT 41.4 05/14/2021 1554   PLT 244 05/14/2021 1554   MCV 82.0 05/14/2021 1554   MCH 26.7 (L) 05/14/2021 1554   MCHC 32.6 05/14/2021 1554   RDW 14.8 05/14/2021 1554   LYMPHSABS 3.0 03/27/2017 0032   MONOABS 0.4 03/27/2017 0032   EOSABS 0.2 03/27/2017 0032   BASOSABS 0.0 03/27/2017 0032    Hgb A1C Lab Results  Component Value Date   HGBA1C 5.9 (H) 05/14/2021        Assessment and Plan:  Upper Respiratory Infection:  High risk because she is a smoker Encourage rest and fluids Advised her to go home  and take a COVID test and let me know if this is positive Rx for Azithromycin 250 mg daily x5 days Rx for Prednisone x6 days Can use Delsym or NyQuil OTC for cough  RTC in 3 months for your annual exam Follow Up Instructions:    I discussed the assessment and treatment plan with the patient. The patient was provided an opportunity to ask questions and all were answered. The patient agreed with the plan and demonstrated an understanding of the instructions.   The patient was advised to  call back or seek an in-person evaluation if the symptoms worsen or if the condition fails to improve as anticipated.   Webb Silversmith, NP

## 2021-11-28 ENCOUNTER — Other Ambulatory Visit: Payer: Self-pay

## 2021-12-04 ENCOUNTER — Other Ambulatory Visit: Payer: Self-pay

## 2021-12-25 ENCOUNTER — Encounter: Payer: Self-pay | Admitting: Internal Medicine

## 2021-12-25 ENCOUNTER — Other Ambulatory Visit: Payer: Self-pay

## 2021-12-25 ENCOUNTER — Ambulatory Visit: Payer: 59 | Admitting: Internal Medicine

## 2021-12-25 VITALS — BP 126/80 | HR 74 | Temp 96.9°F | Ht 64.0 in | Wt 215.0 lb

## 2021-12-25 DIAGNOSIS — Z1231 Encounter for screening mammogram for malignant neoplasm of breast: Secondary | ICD-10-CM

## 2021-12-25 DIAGNOSIS — R7303 Prediabetes: Secondary | ICD-10-CM | POA: Diagnosis not present

## 2021-12-25 DIAGNOSIS — F17200 Nicotine dependence, unspecified, uncomplicated: Secondary | ICD-10-CM

## 2021-12-25 DIAGNOSIS — Z23 Encounter for immunization: Secondary | ICD-10-CM

## 2021-12-25 DIAGNOSIS — E7800 Pure hypercholesterolemia, unspecified: Secondary | ICD-10-CM | POA: Diagnosis not present

## 2021-12-25 DIAGNOSIS — Z0001 Encounter for general adult medical examination with abnormal findings: Secondary | ICD-10-CM | POA: Diagnosis not present

## 2021-12-25 DIAGNOSIS — Z780 Asymptomatic menopausal state: Secondary | ICD-10-CM | POA: Diagnosis not present

## 2021-12-25 DIAGNOSIS — D573 Sickle-cell trait: Secondary | ICD-10-CM | POA: Diagnosis not present

## 2021-12-25 DIAGNOSIS — Z6836 Body mass index (BMI) 36.0-36.9, adult: Secondary | ICD-10-CM

## 2021-12-25 MED ORDER — NICOTINE POLACRILEX 4 MG MT GUM
4.0000 mg | CHEWING_GUM | OROMUCOSAL | 0 refills | Status: DC | PRN
  Filled 2021-12-25: qty 110, 20d supply, fill #0

## 2021-12-25 NOTE — Progress Notes (Signed)
Subjective:    Patient ID: Kathleen Martinez, female    DOB: 03/25/1965, 56 y.o.   MRN: [id number]  HPI  Patient presents to clinic today for her annual exam.  Flu: 11/2020 Tetanus: 09/2017 Pneumovax: 09/2017 COVID: Levan Hurst x 4 Shingrix: Never Pap smear: 01/2018 Mammogram: 05/2020 Bone density: Never Colon screening: 06/2018 Vision screening: as needed Dentist: biannually  Diet: She does eat meat. She consumes fruits and veggies. She does eat fried foods. She drinks mostly flavored water. Exercise: None  Review of Systems     Past Medical History:  Diagnosis Date   Chronic constipation    Chronic fatigue    GERD (gastroesophageal reflux disease)    Low serum vitamin D    Menopausal state     Current Outpatient Medications  Medication Sig Dispense Refill   aspirin 81 MG EC tablet Take 1 tablet (81 mg total) by mouth daily. Swallow whole. 30 tablet 12   azithromycin (ZITHROMAX) 250 MG tablet Take 2 tabs today, then 1 tab daily x 4 days 6 tablet 0   cholecalciferol (VITAMIN D) 1000 units tablet Take 1,000 Units by mouth daily.     clotrimazole-betamethasone (LOTRISONE) cream Apply 1 Application topically 2 (two) times daily. 45 g 3   COVID-19 At Home Antigen Test (CARESTART COVID-19 HOME TEST) KIT Use as directed 2 kit 0   mupirocin ointment (BACTROBAN) 2 % Apply 1 Application topically 2 (two) times daily. 22 g 3   omeprazole (PRILOSEC) 20 MG capsule TAKE 1 CAPSULE (20 MG TOTAL) BY MOUTH DAILY. 90 capsule 1   predniSONE (DELTASONE) 10 MG tablet Take 6 tabs on day 1, 5 tabs on day 2, 4 tabs on day 3, 3 tabs on day 4, 2 tabs on day 5, 1 tab on day 6 21 tablet 0   tiZANidine (ZANAFLEX) 4 MG tablet Take 1 tablet (4 mg total) by mouth 2 (two) times daily as needed for muscle spasms. 30 tablet 2   No current facility-administered medications for this visit.    No Known Allergies  Family History  Problem Relation Age of Onset   Diabetes Mother    Hypertension Father    Diabetes  Father    Diabetes Sister    Diabetes Brother    Breast cancer Neg Hx     Social History   Socioeconomic History   Marital status: Married    Spouse name: Not on file   Number of children: Not on file   Years of education: Not on file   Highest education level: Not on file  Occupational History   Not on file  Tobacco Use   Smoking status: Every Day    Packs/day: 0.50    Years: 21.00    Total pack years: 10.50    Types: Cigarettes   Smokeless tobacco: Never  Vaping Use   Vaping Use: Never used  Substance and Sexual Activity   Alcohol use: Yes    Alcohol/week: 0.0 standard drinks of alcohol    Comment: occasional   Drug use: No   Sexual activity: Never  Other Topics Concern   Not on file  Social History Narrative   Not on file   Social Determinants of Health   Financial Resource Strain: Not on file  Food Insecurity: Not on file  Transportation Needs: Not on file  Physical Activity: Not on file  Stress: Not on file  Social Connections: Not on file  Intimate Partner Violence: Not on file     Constitutional:  Denies fever, malaise, fatigue, headache or abrupt weight changes.  HEENT: Denies eye pain, eye redness, ear pain, ringing in the ears, wax buildup, runny nose, nasal congestion, bloody nose, or sore throat. Respiratory: Denies difficulty breathing, shortness of breath, cough or sputum production.   Cardiovascular: Denies chest pain, chest tightness, palpitations or swelling in the hands or feet.  Gastrointestinal: Patient reports constipation.  Denies abdominal pain, bloating, diarrhea or blood in the stool.  GU: Denies urgency, frequency, pain with urination, burning sensation, blood in urine, odor or discharge. Musculoskeletal: Denies decrease in range of motion, difficulty with gait, muscle pain or joint pain and swelling.  Skin: Denies redness, rashes, lesions or ulcercations.  Neurological: Denies dizziness, difficulty with memory, difficulty with speech  or problems with balance and coordination.  Psych: Denies anxiety, depression, SI/HI.  No other specific complaints in a complete review of systems (except as listed in HPI above).  Objective:   Physical Exam  BP 126/80 (BP Location: Left Arm, Patient Position: Sitting, Cuff Size: Normal)   Pulse 74   Temp (!) 96.9 F (36.1 C) (Temporal)   Ht _0  (1.626 m)   Wt 215 lb (97.5 kg)   SpO2 100%   BMI 36.90 kg/m   Wt Readings from Last 3 Encounters:  08/14/21 206 lb (93.4 kg)  05/14/21 211 lb (95.7 kg)  11/14/20 207 lb 9.6 oz (94.2 kg)    General: Appears her stated age, obese, in NAD. Skin: Warm, dry and intact. HEENT: Head: normal shape and size; Eyes: sclera white, no icterus, conjunctiva pink, PERRLA and EOMs intact;  Neck:  Neck supple, trachea midline. No masses, lumps or thyromegaly present.  Cardiovascular: Normal rate and rhythm. S1,S2 noted.  No murmur, rubs or gallops noted. No JVD or BLE edema. No carotid bruits noted. Pulmonary/Chest: Normal effort and positive vesicular breath sounds. No respiratory distress. No wheezes, rales or ronchi noted.  Abdomen: Soft and nontender. Normal bowel sounds.  Musculoskeletal: Strength 5/5 BUE/BLE. No difficulty with gait.  Neurological: Alert and oriented. Cranial nerves II-XII grossly intact. Coordination normal.  Psychiatric: Mood and affect normal. Behavior is normal. Judgment and thought content normal.    BMET    Component Value Date/Time   NA 140 05/14/2021 1554   K 4.2 05/14/2021 1554   CL 107 05/14/2021 1554   CO2 24 05/14/2021 1554   GLUCOSE 116 05/14/2021 1554   BUN 17 05/14/2021 1554   CREATININE 1.34 (H) 05/14/2021 1554   CALCIUM 9.4 05/14/2021 1554   GFRNONAA 49 (L) 03/27/2017 0032   GFRAA 57 (L) 03/27/2017 0032    Lipid Panel     Component Value Date/Time   CHOL 186 05/14/2021 1554   TRIG 112 05/14/2021 1554   HDL 40 (L) 05/14/2021 1554   CHOLHDL 4.7 05/14/2021 1554   LDLCALC 124 (H) 05/14/2021 1554     CBC    Component Value Date/Time   WBC 6.8 05/14/2021 1554   RBC 5.05 05/14/2021 1554   HGB 13.5 05/14/2021 1554   HCT 41.4 05/14/2021 1554   PLT 244 05/14/2021 1554   MCV 82.0 05/14/2021 1554   MCH 26.7 (L) 05/14/2021 1554   MCHC 32.6 05/14/2021 1554   RDW 14.8 05/14/2021 1554   LYMPHSABS 3.0 03/27/2017 0032   MONOABS 0.4 03/27/2017 0032   EOSABS 0.2 03/27/2017 0032   BASOSABS 0.0 03/27/2017 0032    Hgb A1C Lab Results  Component Value Date   HGBA1C 5.9 (H) 05/14/2021  Assessment & Plan:   Preventative Health Maintenance:  Flu shot today Tetanus UTD Encouraged her to get her COVID booster Pneumovax UTD Discussed Shingrix vaccine, she will check coverage with her insurance company and schedule a nurse visit if she would like to have this done Pap smear UTD Mammogram and bone density ordered-she will call to schedule Low-dose CT lung cancer screening ordered Colon screening UTD Encouraged her to consume a balanced diet and exercise regimen Advised her to see an eye doctor and dentist annually We will check CBC, c-Met, lipid, A1c today  RTC in 6 months, follow-up chronic conditions Webb Silversmith, NP

## 2021-12-25 NOTE — Assessment & Plan Note (Signed)
Encourage diet and exercise for weight loss 

## 2021-12-25 NOTE — Assessment & Plan Note (Signed)
CBC today.  

## 2021-12-25 NOTE — Patient Instructions (Signed)
Health Maintenance for Postmenopausal Women Menopause is a normal process in which your ability to get pregnant comes to an end. This process happens slowly over many months or years, usually between the ages of 48 and 55. Menopause is complete when you have missed your menstrual period for 12 months. It is important to talk with your health care provider about some of the most common conditions that affect women after menopause (postmenopausal women). These include heart disease, cancer, and bone loss (osteoporosis). Adopting a healthy lifestyle and getting preventive care can help to promote your health and wellness. The actions you take can also lower your chances of developing some of these common conditions. What are the signs and symptoms of menopause? During menopause, you may have the following symptoms: Hot flashes. These can be moderate or severe. Night sweats. Decrease in sex drive. Mood swings. Headaches. Tiredness (fatigue). Irritability. Memory problems. Problems falling asleep or staying asleep. Talk with your health care provider about treatment options for your symptoms. Do I need hormone replacement therapy? Hormone replacement therapy is effective in treating symptoms that are caused by menopause, such as hot flashes and night sweats. Hormone replacement carries certain risks, especially as you become older. If you are thinking about using estrogen or estrogen with progestin, discuss the benefits and risks with your health care provider. How can I reduce my risk for heart disease and stroke? The risk of heart disease, heart attack, and stroke increases as you age. One of the causes may be a change in the body's hormones during menopause. This can affect how your body uses dietary fats, triglycerides, and cholesterol. Heart attack and stroke are medical emergencies. There are many things that you can do to help prevent heart disease and stroke. Watch your blood pressure High  blood pressure causes heart disease and increases the risk of stroke. This is more likely to develop in people who have high blood pressure readings or are overweight. Have your blood pressure checked: Every 3-5 years if you are 18-39 years of age. Every year if you are 40 years old or older. Eat a healthy diet  Eat a diet that includes plenty of vegetables, fruits, low-fat dairy products, and lean protein. Do not eat a lot of foods that are high in solid fats, added sugars, or sodium. Get regular exercise Get regular exercise. This is one of the most important things you can do for your health. Most adults should: Try to exercise for at least 150 minutes each week. The exercise should increase your heart rate and make you sweat (moderate-intensity exercise). Try to do strengthening exercises at least twice each week. Do these in addition to the moderate-intensity exercise. Spend less time sitting. Even light physical activity can be beneficial. Other tips Work with your health care provider to achieve or maintain a healthy weight. Do not use any products that contain nicotine or tobacco. These products include cigarettes, chewing tobacco, and vaping devices, such as e-cigarettes. If you need help quitting, ask your health care provider. Know your numbers. Ask your health care provider to check your cholesterol and your blood sugar (glucose). Continue to have your blood tested as directed by your health care provider. Do I need screening for cancer? Depending on your health history and family history, you may need to have cancer screenings at different stages of your life. This may include screening for: Breast cancer. Cervical cancer. Lung cancer. Colorectal cancer. What is my risk for osteoporosis? After menopause, you may be   at increased risk for osteoporosis. Osteoporosis is a condition in which bone destruction happens more quickly than new bone creation. To help prevent osteoporosis or  the bone fractures that can happen because of osteoporosis, you may take the following actions: If you are 19-50 years old, get at least 1,000 mg of calcium and at least 600 international units (IU) of vitamin D per day. If you are older than age 50 but younger than age 70, get at least 1,200 mg of calcium and at least 600 international units (IU) of vitamin D per day. If you are older than age 70, get at least 1,200 mg of calcium and at least 800 international units (IU) of vitamin D per day. Smoking and drinking excessive alcohol increase the risk of osteoporosis. Eat foods that are rich in calcium and vitamin D, and do weight-bearing exercises several times each week as directed by your health care provider. How does menopause affect my mental health? Depression may occur at any age, but it is more common as you become older. Common symptoms of depression include: Feeling depressed. Changes in sleep patterns. Changes in appetite or eating patterns. Feeling an overall lack of motivation or enjoyment of activities that you previously enjoyed. Frequent crying spells. Talk with your health care provider if you think that you are experiencing any of these symptoms. General instructions See your health care provider for regular wellness exams and vaccines. This may include: Scheduling regular health, dental, and eye exams. Getting and maintaining your vaccines. These include: Influenza vaccine. Get this vaccine each year before the flu season begins. Pneumonia vaccine. Shingles vaccine. Tetanus, diphtheria, and pertussis (Tdap) booster vaccine. Your health care provider may also recommend other immunizations. Tell your health care provider if you have ever been abused or do not feel safe at home. Summary Menopause is a normal process in which your ability to get pregnant comes to an end. This condition causes hot flashes, night sweats, decreased interest in sex, mood swings, headaches, or lack  of sleep. Treatment for this condition may include hormone replacement therapy. Take actions to keep yourself healthy, including exercising regularly, eating a healthy diet, watching your weight, and checking your blood pressure and blood sugar levels. Get screened for cancer and depression. Make sure that you are up to date with all your vaccines. This information is not intended to replace advice given to you by your health care provider. Make sure you discuss any questions you have with your health care provider. Document Revised: 05/22/2020 Document Reviewed: 05/22/2020 Elsevier Patient Education  2023 [employer]  

## 2021-12-26 LAB — COMPLETE METABOLIC PANEL WITH GFR
AG Ratio: 1.6 (calc) (ref 1.0–2.5)
ALT: 18 U/L (ref 6–29)
AST: 19 U/L (ref 10–35)
Albumin: 4.2 g/dL (ref 3.6–5.1)
Alkaline phosphatase (APISO): 70 U/L (ref 37–153)
BUN/Creatinine Ratio: 15 (calc) (ref 6–22)
BUN: 16 mg/dL (ref 7–25)
CO2: 26 mmol/L (ref 20–32)
Calcium: 9.5 mg/dL (ref 8.6–10.4)
Chloride: 108 mmol/L (ref 98–110)
Creat: 1.1 mg/dL — ABNORMAL HIGH (ref 0.50–1.03)
Globulin: 2.6 g/dL (calc) (ref 1.9–3.7)
Glucose, Bld: 101 mg/dL — ABNORMAL HIGH (ref 65–99)
Potassium: 5.3 mmol/L (ref 3.5–5.3)
Sodium: 142 mmol/L (ref 135–146)
Total Bilirubin: 0.4 mg/dL (ref 0.2–1.2)
Total Protein: 6.8 g/dL (ref 6.1–8.1)
eGFR: 59 mL/min/{1.73_m2} — ABNORMAL LOW (ref >=60)

## 2021-12-26 LAB — HEMOGLOBIN A1C
Hgb A1c MFr Bld: 6.2 % of total Hgb — ABNORMAL HIGH (ref <5.7)
Mean Plasma Glucose: 131 mg/dL
eAG (mmol/L): 7.3 mmol/L

## 2021-12-26 LAB — LIPID PANEL
Cholesterol: 207 mg/dL — ABNORMAL HIGH (ref <200)
HDL: 45 mg/dL — ABNORMAL LOW (ref >=50)
LDL Cholesterol (Calc): 140 mg/dL (calc) — ABNORMAL HIGH
Non-HDL Cholesterol (Calc): 162 mg/dL (calc) — ABNORMAL HIGH (ref <130)
Total CHOL/HDL Ratio: 4.6 (calc) (ref <5.0)
Triglycerides: 102 mg/dL (ref <150)

## 2021-12-26 LAB — CBC
HCT: 43.5 % (ref 35.0–45.0)
Hemoglobin: 14 g/dL (ref 11.7–15.5)
MCH: 26.8 pg — ABNORMAL LOW (ref 27.0–33.0)
MCHC: 32.2 g/dL (ref 32.0–36.0)
MCV: 83.2 fL (ref 80.0–100.0)
MPV: 11.6 fL (ref 7.5–12.5)
Platelets: 249 10*3/uL (ref 140–400)
RBC: 5.23 10*6/uL — ABNORMAL HIGH (ref 3.80–5.10)
RDW: 14.6 % (ref 11.0–15.0)
WBC: 6 10*3/uL (ref 3.8–10.8)

## 2021-12-27 ENCOUNTER — Other Ambulatory Visit: Payer: Self-pay

## 2021-12-27 MED ORDER — ZOSTER VAC RECOMB ADJUVANTED 50 MCG/0.5ML IM SUSR
INTRAMUSCULAR | 1 refills | Status: DC
  Filled 2021-12-27: qty 0.5, 1d supply, fill #0

## 2022-01-08 ENCOUNTER — Other Ambulatory Visit: Payer: Self-pay

## 2022-02-28 ENCOUNTER — Ambulatory Visit
Admission: RE | Admit: 2022-02-28 | Discharge: 2022-02-28 | Disposition: A | Discharge status: Home / Self Care | Payer: Commercial Managed Care - PPO | Source: Ambulatory Visit | Attending: Internal Medicine | Admitting: Internal Medicine

## 2022-02-28 DIAGNOSIS — Z780 Asymptomatic menopausal state: Secondary | ICD-10-CM | POA: Diagnosis not present

## 2022-02-28 DIAGNOSIS — Z1231 Encounter for screening mammogram for malignant neoplasm of breast: Secondary | ICD-10-CM | POA: Insufficient documentation

## 2022-03-07 ENCOUNTER — Other Ambulatory Visit: Payer: Self-pay

## 2022-03-07 ENCOUNTER — Other Ambulatory Visit: Payer: Self-pay | Admitting: Internal Medicine

## 2022-03-07 DIAGNOSIS — K219 Gastro-esophageal reflux disease without esophagitis: Secondary | ICD-10-CM

## 2022-03-07 MED ORDER — OMEPRAZOLE 20 MG PO CPDR
DELAYED_RELEASE_CAPSULE | Freq: Every day | ORAL | 3 refills | Status: DC
  Filled 2022-03-07: qty 90, 90d supply, fill #0
  Filled 2022-09-02 (×2): qty 90, 90d supply, fill #1
  Filled 2022-12-09: qty 90, 90d supply, fill #2
  Filled 2023-03-04: qty 90, 90d supply, fill #3

## 2022-03-07 NOTE — Telephone Encounter (Signed)
Requested Prescriptions  Pending Prescriptions Disp Refills   omeprazole (PRILOSEC) 20 MG capsule 90 capsule 3    Sig: TAKE 1 CAPSULE (20 MG TOTAL) BY MOUTH DAILY.     Gastroenterology: Proton Pump Inhibitors Passed - 03/07/2022  3:51 PM      Passed - Valid encounter within last 12 months    Recent Outpatient Visits           2 months ago Encounter for general adult medical examination with abnormal findings   [facility] [place], Coralie Keens, NP   3 months ago Acute nasopharyngitis   [facility] [place], [place] W, NP   6 months ago Class 1 obesity due to excess calories with serious comorbidity and body mass index (BMI) of 34.0 to 34.9 in adult   [facility] [place], Coralie Keens, NP   9 months ago [facility] [place], Coralie Keens, NP   1 year ago Need for immunization against influenza   [facility] [place], Coralie Keens, [place]

## 2022-03-08 ENCOUNTER — Other Ambulatory Visit: Payer: Self-pay

## 2022-03-08 ENCOUNTER — Encounter: Payer: Self-pay | Admitting: *Deleted

## 2022-03-08 DIAGNOSIS — Z87891 Personal history of nicotine dependence: Secondary | ICD-10-CM

## 2022-03-08 DIAGNOSIS — F17210 Nicotine dependence, cigarettes, uncomplicated: Secondary | ICD-10-CM

## 2022-03-27 ENCOUNTER — Encounter: Payer: Self-pay | Admitting: Pulmonary Disease

## 2022-03-27 ENCOUNTER — Ambulatory Visit (INDEPENDENT_AMBULATORY_CARE_PROVIDER_SITE_OTHER): Payer: Commercial Managed Care - PPO | Admitting: Pulmonary Disease

## 2022-03-27 DIAGNOSIS — F17210 Nicotine dependence, cigarettes, uncomplicated: Secondary | ICD-10-CM | POA: Diagnosis not present

## 2022-03-27 DIAGNOSIS — Z87891 Personal history of nicotine dependence: Secondary | ICD-10-CM

## 2022-03-27 NOTE — Patient Instructions (Signed)
Thank you for participating in the [place] Lung Cancer Screening Program. It was our pleasure to meet you today. We will call you with the results of your scan within the next few days. Your scan will be assigned a Lung RADS category score by the physicians reading the scans.  This Lung RADS score determines follow up scanning.  See below for description of categories, and follow up screening recommendations. We will be in touch to schedule your follow up screening annually or based on recommendations of our providers. We will fax a copy of your scan results to your Primary Care Physician, or the physician who referred you to the program, to ensure they have the results. Please call the office if you have any questions or concerns regarding your scanning experience or results.  Our office number is [phone]. Please speak with Denise Phelps, RN. , or  Denise Buckner RN, They are  our Lung Cancer Screening RN.'s If They are unavailable when you call, Please leave a message on the voice mail. We will return your call at our earliest convenience.This voice mail is monitored several times a day.  Remember, if your scan is normal, we will scan you annually as long as you continue to meet the criteria for the program. (Age 50-80, Current smoker or smoker who has quit within the last 15 years). If you are a smoker, remember, quitting is the single most powerful action that you can take to decrease your risk of lung cancer and other pulmonary, breathing related problems. We know quitting is hard, and we are here to help.  Please let us know if there is anything we can do to help you meet your goal of quitting. If you are a former smoker, congratulations. We are proud of you! Remain smoke free! Remember you can refer friends or family members through the number above.  We will screen them to make sure they meet criteria for the program. Thank you for helping us take better care of you by  participating in Lung Screening.  You can receive free nicotine replacement therapy ( patches, gum or mints) by calling 1-800-QUIT NOW. Please call so we can get you on the path to becoming  a non-smoker. I know it is hard, but you can do this!  Lung RADS Categories:  Lung RADS 1: no nodules or definitely non-concerning nodules.  Recommendation is for a repeat annual scan in 12 months.  Lung RADS 2:  nodules that are non-concerning in appearance and behavior with a very low likelihood of becoming an active cancer. Recommendation is for a repeat annual scan in 12 months.  Lung RADS 3: nodules that are probably non-concerning , includes nodules with a low likelihood of becoming an active cancer.  Recommendation is for a 6-month repeat screening scan. Often noted after an upper respiratory illness. We will be in touch to make sure you have no questions, and to schedule your 6-month scan.  Lung RADS 4 A: nodules with concerning findings, recommendation is most often for a follow up scan in 3 months or additional testing based on our provider's assessment of the scan. We will be in touch to make sure you have no questions and to schedule the recommended 3 month follow up scan.  Lung RADS 4 B:  indicates findings that are concerning. We will be in touch with you to schedule additional diagnostic testing based on our provider's  assessment of the scan.  Other options for assistance in smoking cessation (   As covered by your insurance benefits)  Hypnosis for smoking cessation  [employer] [phone]  Acupuncture for smoking cessation  [facility] [phone]   

## 2022-03-27 NOTE — Progress Notes (Signed)
Virtual Visit via Telephone Note  I connected with Veva Holes on 03/27/22 at 12:00 PM EDT by telephone and verified that I am speaking with the correct person using two identifiers.  Location: Patient: HOME  Provider: [address] [place], [place], [place] [address]   Shared Decision Making Visit Lung Cancer Screening Program [phone])   Eligibility: Age 57 y.o. Pack Years Smoking History Calculation 39 (# packs/per year x # years smoked) Recent History of coughing up blood  no Unexplained weight loss? no ( >Than 15 pounds within the last 6 months ) Prior History Lung / other cancer no (Diagnosis within the last 5 years already requiring surveillance chest CT Scans). Smoking Status Current Smoker  1ppd   Visit Components: Discussion included one or more decision making aids. yes Discussion included risk/benefits of screening. yes Discussion included potential follow up diagnostic testing for abnormal scans. yes Discussion included meaning and risk of over diagnosis. yes Discussion included meaning and risk of False Positives. yes Discussion included meaning of total radiation exposure. yes  Counseling Included: Importance of adherence to annual lung cancer LDCT screening. yes Impact of comorbidities on ability to participate in the program. yes Ability and willingness to under diagnostic treatment. yes  Smoking Cessation Counseling: Current Smokers:  Discussed importance of smoking cessation. yes Information about tobacco cessation classes and interventions provided to patient. yes Patient provided with "ticket" for LDCT Scan. yes Diagnosis Code: Tobacco Use Z72.0 Asymptomatic Patient yes  Counseling (Intermediate counseling: > three minutes counseling) [id number] Patient provided with "ticket" for LDCT Scan. yes Written Order for Lung Cancer Screening with LDCT placed in Epic. Yes (CT Chest Lung Cancer Screening Low Dose W/O CM) [id number] Z12.2-Screening of respiratory  organs Z87.891-Personal history of nicotine dependence   Lauraine Rinne, NP

## 2022-03-28 ENCOUNTER — Ambulatory Visit
Admission: RE | Admit: 2022-03-28 | Discharge: 2022-03-28 | Disposition: A | Discharge status: Home / Self Care | Payer: Commercial Managed Care - PPO | Source: Ambulatory Visit | Attending: Internal Medicine | Admitting: Internal Medicine

## 2022-03-28 DIAGNOSIS — Z87891 Personal history of nicotine dependence: Secondary | ICD-10-CM | POA: Diagnosis not present

## 2022-03-28 DIAGNOSIS — F17210 Nicotine dependence, cigarettes, uncomplicated: Secondary | ICD-10-CM | POA: Insufficient documentation

## 2022-04-01 ENCOUNTER — Telehealth: Payer: Self-pay | Admitting: Acute Care

## 2022-04-01 NOTE — Telephone Encounter (Signed)
[facility] Call Report

## 2022-04-01 NOTE — Telephone Encounter (Signed)
Received a call from [facility] for patient's lung cancer screening CT. Below is a copy of the impression:   IMPRESSION: 1. Lung-RADS 4B, suspicious. Additional imaging evaluation or consultation with Pulmonology or Thoracic Surgery recommended. Two irregular right upper lobe pulmonary nodules, the largest of which is cavitary and measures 11.6 mm in volume derived mean diameter, both increased in size and density since 09/02/2016 CT, suspicious for primary bronchogenic malignancy. Suggest PET-CT for further characterization at this time. 2. Aortic Atherosclerosis (ICD10-I70.0) and Emphysema (ICD10-J43.9).   These results will be called to the ordering clinician or representative by the Radiologist Assistant, and communication documented in the PACS or [employer].  Will route to Judson Roch and the lung cancer screening pool for follow up.

## 2022-04-02 ENCOUNTER — Telehealth: Payer: Self-pay | Admitting: Acute Care

## 2022-04-02 DIAGNOSIS — R918 Other nonspecific abnormal finding of lung field: Secondary | ICD-10-CM

## 2022-04-02 NOTE — Telephone Encounter (Signed)
I have called the patient with the results of her low-dose screening CT.  I explained that her scan was read as a lung RADS 4B, suspicious.  Patient has 2 irregular right upper lobe nodules measuring 11.6 and 11.3 mm that have increased in size and density from a CTA done in August 2018.  I explained that we need to take a closer look at these nodules with a PET scan .  I explained that I will place the order for the PET scan today and that she should get a call to get it scheduled at the [facility].  She stated that this is a good location for her. I also explained that once the scan has been done she will have follow-up in the [facility] pulmonary [place] office with either Dr. Patsey Berthold or Dr.Dgayli to review the results of the scan and determine next steps in plan of care..  Patient is in agreement with the above plan.  I did have Dr. Patsey Berthold review the scan and she is in agreement that neck step needs to be a PET scan to better evaluate the nodules of concern.  Denise please keep on tickle list to ensure that if this is not a finding concerning for malignancy after PET scan she can return to the lung cancer screening population.  Thanks so much  Margie. I have placed the order for the PET scan at [facility]. Can you please schedule to follow up with Dr. Patsey Berthold or Dgayli within a week after the scan to review and determine best plan of care moving forward? Thanks so much

## 2022-04-02 NOTE — Telephone Encounter (Signed)
Spoke to patient and scheduled appt with Dr. Patsey Berthold for 04/16/2022 at 2:00. Directions giving.

## 2022-04-02 NOTE — Telephone Encounter (Signed)
See telephone note from 04/02/22

## 2022-04-08 ENCOUNTER — Other Ambulatory Visit: Payer: Self-pay | Admitting: Internal Medicine

## 2022-04-08 ENCOUNTER — Other Ambulatory Visit: Payer: Self-pay

## 2022-04-08 DIAGNOSIS — Z716 Tobacco abuse counseling: Secondary | ICD-10-CM

## 2022-04-09 ENCOUNTER — Ambulatory Visit
Admission: RE | Admit: 2022-04-09 | Discharge: 2022-04-09 | Disposition: A | Discharge status: Home / Self Care | Payer: Commercial Managed Care - PPO | Source: Ambulatory Visit | Attending: Acute Care | Admitting: Acute Care

## 2022-04-09 ENCOUNTER — Other Ambulatory Visit: Payer: Self-pay

## 2022-04-09 DIAGNOSIS — R918 Other nonspecific abnormal finding of lung field: Secondary | ICD-10-CM | POA: Insufficient documentation

## 2022-04-09 DIAGNOSIS — J439 Emphysema, unspecified: Secondary | ICD-10-CM | POA: Diagnosis not present

## 2022-04-09 DIAGNOSIS — R911 Solitary pulmonary nodule: Secondary | ICD-10-CM | POA: Diagnosis not present

## 2022-04-09 DIAGNOSIS — I700 Atherosclerosis of aorta: Secondary | ICD-10-CM | POA: Diagnosis not present

## 2022-04-09 LAB — GLUCOSE, CAPILLARY: Glucose-Capillary: 106 mg/dL — ABNORMAL HIGH (ref 70–99)

## 2022-04-09 MED ORDER — FLUDEOXYGLUCOSE F - 18 (FDG) INJECTION
11.1000 | Freq: Once | INTRAVENOUS | Status: AC
  Administered 2022-04-09: 12.01 via INTRAVENOUS

## 2022-04-09 MED ORDER — NICOTINE POLACRILEX 4 MG MT GUM
CHEWING_GUM | OROMUCOSAL | 0 refills | Status: DC
  Filled 2022-04-09: qty 110, 20d supply, fill #0

## 2022-04-09 NOTE — Telephone Encounter (Signed)
Requested Prescriptions  Pending Prescriptions Disp Refills   nicotine polacrilex (SM NICOTINE) 4 MG gum 100 each 0    Sig: USE AS DIRECTED AS NEEDED FOR SMOKING CESSATION     Psychiatry:  Drug Dependence Therapy Passed - 04/08/2022  4:00 PM      Passed - Valid encounter within last 12 months    Recent Outpatient Visits           3 months ago Encounter for general adult medical examination with abnormal findings   [facility] [place], Coralie Keens, NP   4 months ago Acute nasopharyngitis   [facility] [place], [place] W, NP   7 months ago Class 1 obesity due to excess calories with serious comorbidity and body mass index (BMI) of 34.0 to 34.9 in adult   [facility] [place], Coralie Keens, NP   11 months ago [facility] [place], Coralie Keens, NP   1 year ago Need for immunization against influenza   [facility] [place], Coralie Keens, [place]

## 2022-04-16 ENCOUNTER — Other Ambulatory Visit: Payer: Self-pay | Admitting: *Deleted

## 2022-04-16 ENCOUNTER — Ambulatory Visit: Payer: Commercial Managed Care - PPO | Admitting: Pulmonary Disease

## 2022-04-16 ENCOUNTER — Encounter: Payer: Self-pay | Admitting: Pulmonary Disease

## 2022-04-16 VITALS — BP 128/82 | HR 69 | Temp 97.1°F | Ht 64.0 in | Wt 214.0 lb

## 2022-04-16 DIAGNOSIS — J449 Chronic obstructive pulmonary disease, unspecified: Secondary | ICD-10-CM | POA: Diagnosis not present

## 2022-04-16 DIAGNOSIS — R918 Other nonspecific abnormal finding of lung field: Secondary | ICD-10-CM

## 2022-04-16 DIAGNOSIS — F17210 Nicotine dependence, cigarettes, uncomplicated: Secondary | ICD-10-CM

## 2022-04-16 DIAGNOSIS — Z87891 Personal history of nicotine dependence: Secondary | ICD-10-CM

## 2022-04-16 NOTE — Progress Notes (Signed)
Subjective:    Patient ID: Kathleen Martinez, female    DOB: [birth year], 57 y.o.   MRN: [id number] Patient Care Team: Jearld Fenton, NP as PCP - General (Internal Medicine)  Chief Complaint  Patient presents with   Consult    Nodule. SOB with exertion. No wheezing. Dry cough.   HPI Kathleen Martinez is a 57 year old current smoker (0.5 PPD) who presents for evaluation of lung nodules noted on lung cancer screening CT.  She is kindly referred by Webb Silversmith, NP.  She presents today with her husband.  With regards to these findings the patient has been totally asymptomatic.  She does not endorse any pain, no cough or sputum production no hemoptysis.  She has had for a number of years some mild dyspnea on exertion but does not note this to be limiting and attributes it to being overweight.  I reviewed the mandible imaging with the patient.  She had low-dose screening CT on 28 March 2022 and this shows 2 nodules on the right upper lobe that average 11 mm in diameter.  One is cystic and the other 1 is more solid in appearance.  She had a follow-up PET/CT on 09 April 2022 that showed no evidence of uptake on these 2 nodules or elsewhere.  Her CT chest also shows centrilobular as well as paraseptal emphysema, diffuse bronchial wall thickening and a few scattered other pulmonary nodules.  This is highly suspicious for COPD with perhaps respiratory bronchiolitis as well.  Patient does not endorse any weight loss or anorexia.  No fever, chills or sweats.  No chest pain, no orthopnea or paroxysmal nocturnal dyspnea.  No calf tenderness.  She does not voice any other symptomatology.  She works for a [employer].   Review of Systems A 10 point review of systems was performed and it is as noted above otherwise negative.  Past Medical History:  Diagnosis Date   Chronic constipation    Chronic fatigue    GERD (gastroesophageal reflux disease)    Low serum vitamin D    Menopausal state    Past Surgical  History:  Procedure Laterality Date   COLONOSCOPY WITH PROPOFOL N/A 06/30/2020   Procedure: COLONOSCOPY WITH PROPOFOL;  Surgeon: Lucilla Lame, MD;  Location: [facility];  Service: Endoscopy;  Laterality: N/A;   TUBAL LIGATION     Patient Active Problem List   Diagnosis Date Noted   Pure hypercholesterolemia 11/14/2020   Aortic atherosclerosis 11/14/2020   CKD (chronic kidney disease) stage 3, GFR 30-59 ml/min 11/14/2020   Prediabetes 11/14/2020   Class 2 obesity due to excess calories with body mass index (BMI) of 36.0 to 36.9 in adult 11/14/2020   Gastroesophageal reflux disease 01/26/2020   Sickle cell trait 12/24/2018   Chronic constipation 09/13/2014   Social History   Tobacco Use   Smoking status: Every Day    Packs/day: 0.50    Years: 21.00    Additional pack years: 0.00    Total pack years: 10.50    Types: Cigarettes   Smokeless tobacco: Never   Tobacco comments:    0.5 PPD - 04/16/2022 khj  Substance Use Topics   Alcohol use: Yes    Alcohol/week: 0.0 standard drinks of alcohol    Comment: occasional   No Known Allergies Current Meds  Medication Sig   cholecalciferol (VITAMIN D) 1000 units tablet Take 1,000 Units by mouth daily.   clotrimazole-betamethasone (LOTRISONE) cream Apply 1 Application topically 2 (two) times daily.  mupirocin ointment (BACTROBAN) 2 % Apply 1 Application topically 2 (two) times daily.   nicotine polacrilex (NICORETTE) 4 MG gum Take 1 each (4 mg total) by mouth as needed for smoking cessation.   nicotine polacrilex (SM NICOTINE) 4 MG gum USE AS DIRECTED AS NEEDED FOR SMOKING CESSATION   omeprazole (PRILOSEC) 20 MG capsule TAKE 1 CAPSULE (20 MG TOTAL) BY MOUTH DAILY.   tiZANidine (ZANAFLEX) 4 MG tablet Take 1 tablet (4 mg total) by mouth 2 (two) times daily as needed for muscle spasms.   Zoster Vaccine Adjuvanted [facility]) injection Inject into the muscle.   Immunization History  Administered Date(s) Administered    Influenza,inj,Quad PF,6+ Mos 09/24/2017, 11/14/2020, 12/25/2021   Influenza-Unspecified 10/15/2018   Moderna Covid-19 Vaccine Bivalent Booster 77yrs & up 02/08/2021   Moderna SARS-COV2 Booster Vaccination 02/08/2020   Moderna Sars-Covid-2 Vaccination 01/25/2019, 02/22/2019   PPD Test 10/11/2020   Pneumococcal Polysaccharide-23 09/24/2017   Tdap 09/24/2017   Zoster Recombinat (Shingrix) 12/27/2021       Objective:   Physical Exam BP 128/82 (BP Location: Left Arm, Cuff Size: Large)   Pulse 69   Temp (!) 97.1 F (36.2 C)   Ht 5\' 4"  (1.626 m)   Wt 214 lb (97.1 kg)   SpO2 100%   BMI 36.73 kg/m   SpO2: 100 % O2 Device: None (Room air)  GENERAL: Obese woman, no acute distress, fully ambulatory, no conversational dyspnea. HEAD: Normocephalic, atraumatic.  EYES: Pupils equal, round, reactive to light.  No scleral icterus.  MOUTH: Dentition intact, oral mucosa moist.  No thrush. NECK: Supple. No thyromegaly. Trachea midline. No JVD.  No adenopathy. PULMONARY: Good air entry bilaterally.  No adventitious sounds. CARDIOVASCULAR: S1 and S2. Regular rate and rhythm.  No rubs, murmurs or gallops heard. ABDOMEN: Obese, otherwise benign. MUSCULOSKELETAL: No joint deformity, no clubbing, no edema.  NEUROLOGIC: No overt focal deficit, no gait disturbance, speech is fluent. SKIN: Intact,warm,dry. PSYCH: Mood and behavior normal.   Representative images from the CT chest performed March 2024 showing the findings of concern in the right upper lobe, 2 nodules 1 solid and 1 cystic 11 mm in diameter each on average:   Subsequently PET/CT performed 09 April 2022 showed no activity whatsoever on these nodules.  Patient does have findings consistent with COPD and perhaps respiratory bronchiolitis.     Assessment & Plan:     ICD-10-CM   1. 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 Lung nodules  R91.8    PET/CT  negative Nodify Lung ([employer]ix) Repeat CT chest (LDCT) 6 months    3. Moderate smoker (20 or less per day)  F17.210    Patient counseled regards discontinuation of smoking Counseling time 5 to 8 minutes Patient enrolled in lung cancer screening     Orders Placed This Encounter  Procedures   Pulmonary Function Test [facility] Only    Standing Status:   Future    Standing Expiration Date:   04/16/2023    Order Specific Question:   Full PFT: includes the following: basic spirometry, spirometry pre & post bronchodilator, diffusion capacity (DLCO), lung volumes    Answer:   Full PFT    Order Specific Question:   This test can only be performed at    Answer:   [facility]   The patient was counseled regards to discontinuation of smoking.  We have ordered appropriate testing as above.  If I  the lung cancer screening program and they will obtain a 15-month follow-up CT chest for the patient.  Will see her in follow-up in 3 months time she is to contact us prior to that time should any new difficulties arise.  Renold Don, MD Advanced Bronchoscopy PCCM [facility]    *This note was dictated using voice recognition software/Dragon.  Despite best efforts to proofread, errors can occur which can change the meaning. Any transcriptional errors that result from this process are unintentional and may not be fully corrected at the time of dictation.

## 2022-04-16 NOTE — Patient Instructions (Addendum)
You can receive free nicotine replacement therapy (patches, gum, or mints) by calling 1-800-QUIT NOW. Please call so we can get you on the path to becoming a non-smoker. I know it is hard, but you can do this!  www.becomeanex.org this is a website by the [facility] which is very good.  We are arranging for a follow-up CT through the lung cancer screening program.  They will call you when is closer to the time to schedule it which will be around 6 months.  Initially also some breathing tests that will help [place] see how your lungs are working.  We did a blood test today, we will call you with the results when these are back.  This usually takes 7 to 14 days.  We will see you in follow-up in 3 months time call sooner should any new problems arise.

## 2022-05-02 ENCOUNTER — Telehealth: Payer: Self-pay

## 2022-05-02 NOTE — Telephone Encounter (Signed)
Notify results reviewed by Dr. Jayme Cloud- Test is indeterminate.  I have notified the patient. Nothing further needed.

## 2022-05-06 ENCOUNTER — Ambulatory Visit: Payer: Self-pay | Admitting: *Deleted

## 2022-05-06 NOTE — Telephone Encounter (Signed)
  Chief Complaint: sinus congestion Symptoms: head is stopped up and patient has chills Frequency: started Friday Pertinent Negatives: Patient denies sore throat, difficulty breathing Disposition: ED /[] Urgent Care (no appt availability in office) / Appointment(In office/virtual)/  [facility]/ Home Care/ Refused Recommended Disposition /[]  Mobile Bus/  Follow-up with PCP Additional Notes: Patient is requesting medication- patient advised she will need appointment for evaluation

## 2022-05-06 NOTE — Telephone Encounter (Signed)
Message from Allen Kell sent at 05/06/2022  9:16 AM EDT  Summary: sinus infection?   Pt states that she is having sinus infection issues. Mucus has turned dark green and pt is congested. Pt is wanting PCP to call in some medication for her. Please.         Reason for Disposition  Earache    Patient reports ear pressure- especially with blowing nose  Answer Assessment - Initial Assessment Questions 1. LOCATION: "Where does it hurt?"      Head is stopped up and patient has chills 2. ONSET: "When did the sinus pain start?"  (e.g., hours, days)      Started Friday 3. SEVERITY: "How bad is the pain?"   (Scale 1-10; mild, moderate or severe)   - MILD (1-3): doesn't interfere with normal activities    - MODERATE (4-7): interferes with normal activities (e.g., work or school) or awakens from sleep   - SEVERE (8-10): excruciating pain and patient unable to do any normal activities        Mild/moderate 4. RECURRENT SYMPTOM: "Have you ever had sinus problems before?" If Yes, ask: "When was the last time?" and "What happened that time?"      no 5. NASAL CONGESTION: "Is the nose blocked?" If Yes, ask: "Can you open it or must you breathe through your mouth?"     One side stopped up 6. NASAL DISCHARGE: "Do you have discharge from your nose?" If so ask, "What color?"     green 7. FEVER: "Do you have a fever?" If Yes, ask: "What is it, how was it measured, and when did it start?"      Chills only last night 8. OTHER SYMPTOMS: "Do you have any other symptoms?" (e.g., sore throat, cough, earache, difficulty breathing)     Cough, ear pressure  Protocols used: Sinus Pain or Congestion-A-AH

## 2022-05-07 ENCOUNTER — Ambulatory Visit: Payer: Commercial Managed Care - PPO | Admitting: Internal Medicine

## 2022-05-17 ENCOUNTER — Other Ambulatory Visit: Payer: Self-pay | Admitting: Internal Medicine

## 2022-05-17 ENCOUNTER — Other Ambulatory Visit: Payer: Self-pay

## 2022-05-17 DIAGNOSIS — Z716 Tobacco abuse counseling: Secondary | ICD-10-CM

## 2022-05-17 MED FILL — Nicotine Polacrilex Gum 4 MG: OROMUCOSAL | 10 days supply | Qty: 110 | Fill #0 | Status: AC

## 2022-05-17 NOTE — Telephone Encounter (Signed)
Requested Prescriptions  Pending Prescriptions Disp Refills   SM NICOTINE POLACRILEX 4 MG gum [Pharmacy Med Name: nicotine polacrilex (SM NICOTINE) 4 MG gum] 100 each 0    Sig: USE AS DIRECTED AS NEEDED FOR SMOKING CESSATION     Psychiatry:  Drug Dependence Therapy Passed - 05/17/2022  1:34 PM      Passed - Valid encounter within last 12 months    Recent Outpatient Visits           4 months ago Encounter for general adult medical examination with abnormal findings   [facility] [place], Salvadore [place], NP   5 months ago Acute nasopharyngitis   [facility] [place], [place] W, NP   9 months ago Class 1 obesity due to excess calories with serious comorbidity and body mass index (BMI) of 34.0 to 34.9 in adult   [facility] [place], Salvadore Oxford, NP   1 year ago Smoker   [place] [facility] [place], Salvadore Oxford, NP   1 year ago Need for immunization against influenza   [facility] [place], Salvadore Oxford, [place]

## 2022-05-20 ENCOUNTER — Other Ambulatory Visit: Payer: Self-pay

## 2022-06-17 ENCOUNTER — Other Ambulatory Visit: Payer: Self-pay

## 2022-06-17 ENCOUNTER — Other Ambulatory Visit: Payer: Self-pay | Admitting: Internal Medicine

## 2022-06-17 DIAGNOSIS — Z716 Tobacco abuse counseling: Secondary | ICD-10-CM

## 2022-06-18 ENCOUNTER — Other Ambulatory Visit: Payer: Self-pay

## 2022-06-18 MED FILL — Nicotine Polacrilex Gum 4 MG: OROMUCOSAL | 20 days supply | Qty: 110 | Fill #0 | Status: AC

## 2022-06-18 NOTE — Telephone Encounter (Signed)
Requested Prescriptions  Pending Prescriptions Disp Refills   SM NICOTINE POLACRILEX 4 MG gum [Pharmacy Med Name: nicotine polacrilex (SM NICOTINE POLACRILEX) 4 MG gum] 110 each 0    Sig: USE AS DIRECTED AS NEEDED FOR SMOKING CESSATION     Psychiatry:  Drug Dependence Therapy Passed - 06/17/2022 12:23 PM      Passed - Valid encounter within last 12 months    Recent Outpatient Visits           5 months ago Encounter for general adult medical examination with abnormal findings   [facility] [place], Salvadore Oxford, NP   7 months ago Acute nasopharyngitis   [facility] [place], [place] W, NP   10 months ago Class 1 obesity due to excess calories with serious comorbidity and body mass index (BMI) of 34.0 to 34.9 in adult   [facility] [place], Salvadore Oxford, NP   1 year ago Smoker   [facility] [place], Salvadore Oxford, NP   1 year ago Need for immunization against influenza   [facility] [place], Salvadore Oxford, [place]

## 2022-07-26 ENCOUNTER — Other Ambulatory Visit: Payer: Self-pay

## 2022-07-26 ENCOUNTER — Other Ambulatory Visit: Payer: Self-pay | Admitting: Internal Medicine

## 2022-07-26 DIAGNOSIS — Z716 Tobacco abuse counseling: Secondary | ICD-10-CM

## 2022-07-26 MED FILL — Nicotine Polacrilex Gum 4 MG: OROMUCOSAL | 20 days supply | Qty: 110 | Fill #0 | Status: AC

## 2022-07-26 NOTE — Telephone Encounter (Signed)
Requested Prescriptions  Pending Prescriptions Disp Refills   SM NICOTINE POLACRILEX 4 MG gum [Pharmacy Med Name: nicotine polacrilex (SM NICOTINE POLACRILEX) 4 MG gum] 110 each 0    Sig: USE AS DIRECTED AS NEEDED FOR SMOKING CESSATION     Psychiatry:  Drug Dependence Therapy Passed - 07/26/2022  1:54 PM      Passed - Valid encounter within last 12 months    Recent Outpatient Visits           7 months ago Encounter for general adult medical examination with abnormal findings   [facility] [place], Salvadore Oxford, NP   8 months ago Acute nasopharyngitis   [place] [place], [place] W, NP   11 months ago Class 1 obesity due to excess calories with serious comorbidity and body mass index (BMI) of 34.0 to 34.9 in adult   [facility] [place], Salvadore Oxford, NP   1 year ago Smoker   [place] [place], Salvadore Oxford, NP   1 year ago Need for immunization against influenza   [facility] [place], Salvadore Oxford, [place]

## 2022-07-30 ENCOUNTER — Ambulatory Visit: Payer: Commercial Managed Care - PPO | Attending: Pulmonary Disease

## 2022-07-30 DIAGNOSIS — J449 Chronic obstructive pulmonary disease, unspecified: Secondary | ICD-10-CM | POA: Diagnosis not present

## 2022-07-30 DIAGNOSIS — J439 Emphysema, unspecified: Secondary | ICD-10-CM | POA: Insufficient documentation

## 2022-07-30 LAB — PULMONARY FUNCTION TEST ARMC ONLY
DL/VA % pred: 54 %
DL/VA: 2.35 ml/min/mmHg/L
DLCO unc % pred: 29 %
DLCO unc: 5.9 ml/min/mmHg
FEF 25-75 Post: 2.39 L/sec
FEF 25-75 Pre: 2.17 L/sec
FEF2575-%Change-Post: 10 %
FEF2575-%Pred-Post: 98 %
FEF2575-%Pred-Pre: 89 %
FEV1-%Change-Post: 3 %
FEV1-%Pred-Post: 84 %
FEV1-%Pred-Pre: 81 %
FEV1-Post: 2.15 L
FEV1-Pre: 2.08 L
FEV1FVC-%Change-Post: 1 %
FEV1FVC-%Pred-Pre: 103 %
FEV6-%Change-Post: 2 %
FEV6-%Pred-Post: 82 %
FEV6-%Pred-Pre: 81 %
FEV6-Post: 2.62 L
FEV6-Pre: 2.56 L
FEV6FVC-%Pred-Post: 103 %
FEV6FVC-%Pred-Pre: 103 %
FVC-%Change-Post: 2 %
FVC-%Pred-Post: 80 %
FVC-%Pred-Pre: 78 %
FVC-Post: 2.62 L
FVC-Pre: 2.56 L
Post FEV1/FVC ratio: 82 %
Post FEV6/FVC ratio: 100 %
Pre FEV1/FVC ratio: 81 %
Pre FEV6/FVC Ratio: 100 %
RV % pred: 93 %
RV: 1.75 L
TLC % pred: 94 %
TLC: 4.63 L

## 2022-07-30 MED ORDER — ALBUTEROL SULFATE (2.5 MG/3ML) 0.083% IN NEBU
2.5000 mg | INHALATION_SOLUTION | Freq: Once | RESPIRATORY_TRACT | Status: AC
  Administered 2022-07-30: 2.5 mg via RESPIRATORY_TRACT
  Filled 2022-07-30: qty 3

## 2022-08-05 ENCOUNTER — Ambulatory Visit (INDEPENDENT_AMBULATORY_CARE_PROVIDER_SITE_OTHER): Payer: Commercial Managed Care - PPO | Admitting: Pulmonary Disease

## 2022-08-05 ENCOUNTER — Encounter: Payer: Self-pay | Admitting: Pulmonary Disease

## 2022-08-05 VITALS — BP 118/82 | HR 79 | Temp 98.0°F | Ht 64.0 in | Wt 220.4 lb

## 2022-08-05 DIAGNOSIS — Z87891 Personal history of nicotine dependence: Secondary | ICD-10-CM

## 2022-08-05 DIAGNOSIS — Z9189 Other specified personal risk factors, not elsewhere classified: Secondary | ICD-10-CM

## 2022-08-05 DIAGNOSIS — J4489 Other specified chronic obstructive pulmonary disease: Secondary | ICD-10-CM

## 2022-08-05 DIAGNOSIS — J439 Emphysema, unspecified: Secondary | ICD-10-CM

## 2022-08-05 DIAGNOSIS — R918 Other nonspecific abnormal finding of lung field: Secondary | ICD-10-CM

## 2022-08-05 MED ORDER — STIOLTO RESPIMAT 2.5-2.5 MCG/ACT IN AERS: 2.0000 | INHALATION_SPRAY | Freq: Every day | RESPIRATORY_TRACT | 0 refills | Status: DC

## 2022-08-05 NOTE — Progress Notes (Unsigned)
Subjective:    Patient ID: Kathleen Martinez, female    DOB: [birth year], 57 y.o.   MRN: [id number]  Patient Care Team: Lorre Munroe, NP as PCP - General (Internal Medicine)  Chief Complaint  Patient presents with   Follow-up    DOE. Little wheezing. No cough.   HPI Kathleen Martinez is a 57 year old, former smoker(quit April 2024, 10.5 PY) who presents for follow-up of lung nodules noted on lung cancer screening CT and COPD.  Continues to have issues with dyspnea on exertion which she has had for a number of years.  No cough, occasional wheezing noted.  She had low-dose screening CT on 28 March 2022 and this showed 2 nodules on the right upper lobe that average 11 mm in diameter.  One is cystic and the other 1 is more solid in appearance.  She had a follow-up PET/CT on 09 April 2022 that showed no evidence of uptake on these 2 nodules or elsewhere.  Her CT chest also shows centrilobular as well as paraseptal emphysema, diffuse bronchial wall thickening and a few scattered other pulmonary nodules. This is highly suspicious for COPD with perhaps respiratory bronchiolitis as well. She has follow-up CT scheduled for 10 October 2022.   She had PFTs performed 30 July 2022 that showed an FEV1 of 2.08 L or 81% predicted, FVC of 2.56 L or 78% predicted, FEV1/FVC of 81%, lung volumes normal, there is air trapping present dictated by reduced FVC relative to SVC.  Consistent with minimal obstructive airways disease.  Diffusion capacity appeared to be severely decreased however after review it appears that diffusion capacity measurement was invalid due to equipment leak.   Patient does not endorse any weight loss or anorexia.  No fever, chills or sweats.  No chest pain, no orthopnea or paroxysmal nocturnal dyspnea.  No calf tenderness.  She does note that she has had significant snoring, nonrestorative sleep and frequent nocturnal awakenings without explanation.   She does not voice any other  symptomatology.    Review of Systems A 10 point review of systems was performed and it is as noted above otherwise negative.   Patient Active Problem List   Diagnosis Date Noted   COPD suggested by initial evaluation (HCC) 07/30/2022   Pure hypercholesterolemia 11/14/2020   Aortic atherosclerosis (HCC) 11/14/2020   CKD (chronic kidney disease) stage 3, GFR 30-59 ml/min (HCC) 11/14/2020   Prediabetes 11/14/2020   Class 2 obesity due to excess calories with body mass index (BMI) of 36.0 to 36.9 in adult 11/14/2020   Gastroesophageal reflux disease 01/26/2020   Sickle cell trait (HCC) 12/24/2018   Chronic constipation 09/13/2014    Social History   Tobacco Use   Smoking status: Former    Current packs/day: 0.50    Average packs/day: 0.5 packs/day for 21.0 years (10.5 ttl pk-yrs)    Types: Cigarettes   Smokeless tobacco: Never   Tobacco comments:    Quit smoking 04/2022  Substance Use Topics   Alcohol use: Yes    Alcohol/week: 0.0 standard drinks of alcohol    Comment: occasional    No Known Allergies  Current Meds  Medication Sig   cholecalciferol (VITAMIN D) 1000 units tablet Take 1,000 Units by mouth daily.   clotrimazole-betamethasone (LOTRISONE) cream Apply 1 Application topically 2 (two) times daily.   mupirocin ointment (BACTROBAN) 2 % Apply 1 Application topically 2 (two) times daily.   nicotine polacrilex (NICORETTE) 4 MG gum Take 1 each (4 mg total) by mouth as  needed for smoking cessation.   nicotine polacrilex (SM NICOTINE POLACRILEX) 4 MG gum USE AS DIRECTED AS NEEDED FOR SMOKING CESSATION   omeprazole (PRILOSEC) 20 MG capsule TAKE 1 CAPSULE (20 MG TOTAL) BY MOUTH DAILY.   Tiotropium Bromide-Olodaterol (STIOLTO RESPIMAT) 2.5-2.5 MCG/ACT AERS Inhale 2 puffs into the lungs daily.   tiZANidine (ZANAFLEX) 4 MG tablet Take 1 tablet (4 mg total) by mouth 2 (two) times daily as needed for muscle spasms.   Zoster Vaccine Adjuvanted [facility]) injection Inject into the  muscle.    Immunization History  Administered Date(s) Administered   Influenza,inj,Quad PF,6+ Mos 09/24/2017, 11/14/2020, 12/25/2021   Influenza-Unspecified 10/15/2018   Moderna Covid-19 Vaccine Bivalent Booster 11yrs & up 02/08/2021   Moderna SARS-COV2 Booster Vaccination 02/08/2020   Moderna Sars-Covid-2 Vaccination 01/25/2019, 02/22/2019   PPD Test 10/11/2020   Pneumococcal Polysaccharide-23 09/24/2017   Tdap 09/24/2017   Zoster Recombinant(Shingrix) 12/27/2021        Objective:     BP 118/82 (BP Location: Left Arm, Cuff Size: Large)   Pulse 79   Temp 98 F (36.7 C)   Ht 5\' 4"  (1.626 m)   Wt 220 lb 6.4 oz (100 kg)   SpO2 100%   BMI 37.83 kg/m   SpO2: 100 % O2 Device: None (Room air)  GENERAL: Obese woman, no acute distress, fully ambulatory, no conversational dyspnea. HEAD: Normocephalic, atraumatic.  EYES: Pupils equal, round, reactive to light.  No scleral icterus.  MOUTH: Dentition intact, oral mucosa moist.  No thrush. NECK: Supple. No thyromegaly. Trachea midline. No JVD.  No adenopathy. PULMONARY: Good air entry bilaterally.  Coarse otherwise, no adventitious sounds. CARDIOVASCULAR: S1 and S2. Regular rate and rhythm.  No rubs, murmurs or gallops heard. ABDOMEN: Obese, otherwise benign. MUSCULOSKELETAL: No joint deformity, no clubbing, no edema.  NEUROLOGIC: No overt focal deficit, no gait disturbance, speech is fluent. SKIN: Intact,warm,dry. PSYCH: Mood and behavior normal.     08/05/2022    9:00 AM  Results of the Epworth flowsheet  Sitting and reading 3  Watching TV 3  Sitting, inactive in a public place (e.g. a theatre or a meeting) 3  As a passenger in a car for an hour without a break 3  Lying down to rest in the afternoon when circumstances permit 3  Sitting and talking to someone 1  Sitting quietly after a lunch without alcohol 3  In a car, while stopped for a few minutes in traffic 0  Total score 19      Assessment & Plan:      ICD-10-CM   1. COPD with chronic bronchitis and emphysema (HCC)  J44.89    J43.9     2. Lung nodules  R91.8     3. At risk for sleep apnea  Z91.89 Home sleep test    4. Former smoker  Z87.891       Orders Placed This Encounter  Procedures   Home sleep test    Standing Status:   Future    Standing Expiration Date:   08/05/2023    Order Specific Question:   Where should this test be performed:    Answer:   [facility] - [facility]    Meds ordered this encounter  Medications   Tiotropium Bromide-Olodaterol (STIOLTO RESPIMAT) 2.5-2.5 MCG/ACT AERS    Sig: Inhale 2 puffs into the lungs daily.    Dispense:  8 g    Refill:  0    Order Specific Question:   Lot Number?    Answer:  [id number]    Order Specific Question:   Expiration Date?    Answer:   09/15/2023    Order Specific Question:   Quantity    Answer:   2   Smoking cessation instruction/counseling given:  commended patient for quitting and reviewed strategies for preventing relapses.  Will see the patient in follow-up on the first week of October to discuss findings from her CT scan.  She is to contact us prior to that time should any new difficulties arise.  Gailen Shelter, MD Advanced Bronchoscopy PCCM [place]    *This note was dictated using voice recognition software/Dragon.  Despite best efforts to proofread, errors can occur which can change the meaning. Any transcriptional errors that result from this process are unintentional and may not be fully corrected at the time of dictation.

## 2022-08-05 NOTE — Patient Instructions (Signed)
We are going to investigate your sleep symptoms a little bit further.  We are ordering a home sleep study.  We are giving you a trial of an inhaler called Stiolto this is 2 puffs daily (once a day) make sure you use it pretty much around the same time every day.  Let us know if the Stiolto works well for you so that we can call it into your pharmacy.  You have your follow-up CT scan on 26 September.  We will see you in follow-up around the first part of October so we can go these findings.  Call sooner should any new difficulties arise.

## 2022-08-21 ENCOUNTER — Other Ambulatory Visit: Payer: Self-pay | Admitting: Internal Medicine

## 2022-08-21 DIAGNOSIS — Z716 Tobacco abuse counseling: Secondary | ICD-10-CM

## 2022-08-22 ENCOUNTER — Other Ambulatory Visit: Payer: Self-pay | Admitting: Internal Medicine

## 2022-08-22 ENCOUNTER — Other Ambulatory Visit: Payer: Self-pay

## 2022-08-22 DIAGNOSIS — Z716 Tobacco abuse counseling: Secondary | ICD-10-CM

## 2022-08-23 ENCOUNTER — Other Ambulatory Visit: Payer: Self-pay

## 2022-08-23 NOTE — Telephone Encounter (Signed)
Requested medications are due for refill today.  unsure  Requested medications are on the active medications list.  yes  Last refill. 07/26/2022 #110   Future visit scheduled.   no  Notes to clinic.  Unsure of dosage per day. Please review for refill.    Requested Prescriptions  Pending Prescriptions Disp Refills   SM NICOTINE POLACRILEX 4 MG gum [Pharmacy Med Name: nicotine polacrilex (SM NICOTINE POLACRILEX) 4 MG gum] 110 each 0    Sig: USE AS DIRECTED AS NEEDED FOR SMOKING CESSATION     Psychiatry:  Drug Dependence Therapy Passed - 08/22/2022 11:46 AM      Passed - Valid encounter within last 12 months    Recent Outpatient Visits           8 months ago Encounter for general adult medical examination with abnormal findings   [place] [facility] [place], Salvadore Oxford, NP   9 months ago Acute nasopharyngitis   [place] [facility] [place], [place] W, NP   1 year ago Class 1 obesity due to excess calories with serious comorbidity and body mass index (BMI) of 34.0 to 34.9 in adult   [facility] [place], Salvadore Oxford, NP   1 year ago Smoker   [place] [facility] [place], Salvadore Oxford, NP   1 year ago Need for immunization against influenza   [facility] [place], Salvadore Oxford, [place]

## 2022-09-02 ENCOUNTER — Other Ambulatory Visit: Payer: Self-pay

## 2022-09-02 ENCOUNTER — Encounter (INDEPENDENT_AMBULATORY_CARE_PROVIDER_SITE_OTHER): Payer: Commercial Managed Care - PPO

## 2022-09-02 ENCOUNTER — Telehealth: Payer: Self-pay | Admitting: Pulmonary Disease

## 2022-09-02 DIAGNOSIS — G4710 Hypersomnia, unspecified: Secondary | ICD-10-CM

## 2022-09-02 DIAGNOSIS — Z9189 Other specified personal risk factors, not elsewhere classified: Secondary | ICD-10-CM

## 2022-09-02 NOTE — Telephone Encounter (Signed)
Call patient  Sleep study result  Date of study: 08/13/2022  Impression: Negative study for significant sleep disordered breathing with AHI of 4.1.  Very minimal oxygen desaturations  Recommendation: Clinical follow-up of symptoms  Encourage aggressive weight loss measures  Optimize sleep hygiene  If there remains significant clinical concern for sleep disordered breathing, may consider an in lab study.

## 2022-09-03 NOTE — Telephone Encounter (Signed)
Lm x1 for patient.  

## 2022-09-03 NOTE — Telephone Encounter (Signed)
Her sleep study did not show significant sleep apnea.  Her symptoms may be improved by weight loss and physical conditioning program.  If her symptoms persist we would have to do an in lab sleep study.

## 2022-09-04 NOTE — Telephone Encounter (Signed)
Patient is aware of below message/results and voiced her understanding.  Nothing further needed.

## 2022-10-10 ENCOUNTER — Ambulatory Visit
Admission: RE | Admit: 2022-10-10 | Discharge: 2022-10-10 | Disposition: A | Discharge status: Home / Self Care | Payer: Commercial Managed Care - PPO | Source: Ambulatory Visit | Attending: Acute Care | Admitting: Acute Care

## 2022-10-10 DIAGNOSIS — Z87891 Personal history of nicotine dependence: Secondary | ICD-10-CM | POA: Diagnosis not present

## 2022-10-10 DIAGNOSIS — F17210 Nicotine dependence, cigarettes, uncomplicated: Secondary | ICD-10-CM | POA: Diagnosis not present

## 2022-10-10 DIAGNOSIS — R918 Other nonspecific abnormal finding of lung field: Secondary | ICD-10-CM | POA: Diagnosis not present

## 2022-10-25 ENCOUNTER — Telehealth: Payer: Self-pay | Admitting: Acute Care

## 2022-10-25 NOTE — Telephone Encounter (Signed)
Called and left VM for patient to review results.    IMPRESSION: 1. Right upper lobe nodules are decreased in size. Lung-RADS 3, probably benign findings. Short-term follow-up in 6 months is recommended with repeat low-dose chest CT without contrast (please use the following order, CT CHEST LCS NODULE FOLLOW-UP W/O CM). 2. Aortic Atherosclerosis (ICD10-I70.0) and Emphysema (ICD10-J43.9).     Electronically Signed   By: Allegra Lai M.D.   On: 10/25/2022 08:24

## 2022-10-30 ENCOUNTER — Ambulatory Visit: Payer: Commercial Managed Care - PPO | Admitting: Pulmonary Disease

## 2022-10-30 ENCOUNTER — Other Ambulatory Visit: Payer: Self-pay

## 2022-10-30 ENCOUNTER — Encounter: Payer: Self-pay | Admitting: Pulmonary Disease

## 2022-10-30 VITALS — BP 118/78 | HR 75 | Temp 97.7°F | Ht 64.0 in | Wt 226.2 lb

## 2022-10-30 DIAGNOSIS — J439 Emphysema, unspecified: Secondary | ICD-10-CM

## 2022-10-30 DIAGNOSIS — J4489 Other specified chronic obstructive pulmonary disease: Secondary | ICD-10-CM | POA: Diagnosis not present

## 2022-10-30 DIAGNOSIS — Z23 Encounter for immunization: Secondary | ICD-10-CM

## 2022-10-30 DIAGNOSIS — J84115 Respiratory bronchiolitis interstitial lung disease: Secondary | ICD-10-CM

## 2022-10-30 DIAGNOSIS — Z87891 Personal history of nicotine dependence: Secondary | ICD-10-CM

## 2022-10-30 DIAGNOSIS — R918 Other nonspecific abnormal finding of lung field: Secondary | ICD-10-CM

## 2022-10-30 MED ORDER — ALBUTEROL SULFATE HFA 108 (90 BASE) MCG/ACT IN AERS
2.0000 | INHALATION_SPRAY | Freq: Four times a day (QID) | RESPIRATORY_TRACT | 2 refills | Status: AC | PRN
  Filled 2022-10-30: qty 6.7, 25d supply, fill #0
  Filled 2022-12-09: qty 6.7, 25d supply, fill #1

## 2022-10-30 NOTE — Patient Instructions (Signed)
VISIT SUMMARY:  During your visit, we discussed your continued daytime sleepiness, shortness of breath, and your history of smoking and lung nodules. You've been doing well with not smoking, and we noted that the lung nodules seen on your scans have improved, likely due to your cessation of smoking. We also discussed your inconsistent use of Stiolto, a medication prescribed for your breathing issues.  YOUR PLAN:  -COPD: COPD, or Chronic Obstructive Pulmonary Disease, is a lung condition that makes it hard to breathe. We noted that you've been having some shortness of breath and have not been consistently using your Stiolto medication. To help with this, we're prescribing Albuterol, which you can use during episodes of shortness of breath.   -SMOKER'S BRONCHIOLITIS: Smoker's Bronchiolitis is a condition where the small airways in your lungs become inflamed due to smoking. You've done a great job quitting smoking, and we've seen improvement in your lung nodules as a result. Please continue to abstain from smoking.  -GENERAL HEALTH MAINTENANCE: To keep you in good health, we administered your flu shot today. We've also scheduled a follow-up lung scan for six months from October 10, 2022. We'll have a follow-up appointment after your next scan.  INSTRUCTIONS:  Please remember to use your Albuterol inhaler during episodes of shortness of breath. Continue to abstain from smoking. We've scheduled your next lung scan for six months from October 10, 2022, and we'll have a follow-up appointment after that scan.

## 2022-10-30 NOTE — Progress Notes (Signed)
Subjective:    Patient ID: Kathleen Martinez, female    DOB: [birth year], 57 y.o.   MRN: [id number]  Patient Care Team: Lorre Munroe, NP as PCP - General (Internal Medicine) Salena Saner, MD as Consulting Physician (Pulmonary Disease)  Chief Complaint  Patient presents with   Follow-up    DOE. Occasional dry cough. No wheezing.     BACKGROUND/INTERVAL:Kathleen Martinez is a 57 year old, former smoker(quit April 2024, 10.5 PY) who presents for follow-up of lung nodules noted on lung cancer screening CT and COPD.  Nodules noted to be inflammatory by subsequent testing.  She was last seen here on 05 August 2022 at that time home sleep study was obtained to time somnolence which was performed on 13 August 2022 and showed no evidence of sleep apnea.  HPI Discussed the use of AI scribe software for clinical note transcription with the patient, who gave verbal consent to proceed.  History of Present Illness   The patient, with a history of smoking and lung nodules, reports continued daytime sleepiness despite a negative sleep apnea study. She has been maintaining abstinence from smoking. The patient also reports shortness of breath, but admits to inconsistent use of prescribed Stiolto. She was unable to determine if the medication was beneficial due to not having it on hand during episodes of dyspnea.  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  The patient's pulmonary function tests were reportedly not too severe. 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  The patient denies any leg swelling. She has not yet received her flu shot for the season. She has not expressed any specific concerns during the consultation.     We discussed the findings on the  lung cancer screening chest CT from 26 September with the patient and her husband was with her today.   Review of Systems A 10 point review of systems was performed and it is as noted above otherwise negative.   Patient Active Problem List   Diagnosis Date Noted   COPD suggested by initial evaluation (HCC) 07/30/2022   Pure hypercholesterolemia 11/14/2020   Aortic atherosclerosis (HCC) 11/14/2020   CKD (chronic kidney disease) stage 3, GFR 30-59 ml/min (HCC) 11/14/2020   Prediabetes 11/14/2020   Class 2 obesity due to excess calories with body mass index (BMI) of 36.0 to 36.9 in adult 11/14/2020   Gastroesophageal reflux disease 01/26/2020   Sickle cell trait (HCC) 12/24/2018   Chronic constipation 09/13/2014    Social History   Tobacco Use   Smoking status: Former    Current packs/day: 0.50    Average packs/day: 0.5 packs/day for 21.0 years (10.5 ttl pk-yrs)    Types: Cigarettes   Smokeless tobacco: Never   Tobacco comments:    Quit smoking 04/2022  Substance Use Topics   Alcohol use: Yes    Alcohol/week: 0.0 standard drinks of alcohol    Comment: occasional    No Known Allergies  Current Meds  Medication Sig   aspirin 81 MG EC tablet Take 1 tablet (81 mg total) by mouth daily. Swallow whole.   cholecalciferol (VITAMIN D) 1000 units tablet Take 1,000 Units by mouth daily.   clotrimazole-betamethasone (LOTRISONE) cream Apply 1 Application topically 2 (two) times daily.   mupirocin ointment (BACTROBAN) 2 % Apply  1 Application topically 2 (two) times daily.   omeprazole (PRILOSEC) 20 MG capsule TAKE 1 CAPSULE (20 MG TOTAL) BY MOUTH DAILY.   tiZANidine (ZANAFLEX) 4 MG tablet Take 1 tablet (4 mg total) by mouth 2 (two) times daily as needed for muscle spasms.   Zoster Vaccine Adjuvanted [facility]) injection Inject into the muscle.    Immunization History  Administered Date(s) Administered   Influenza,inj,Quad PF,6+ Mos 09/24/2017, 11/14/2020, 12/25/2021    Influenza-Unspecified 10/15/2018   Moderna Covid-19 Vaccine Bivalent Booster 65yrs & up 02/08/2021   Moderna SARS-COV2 Booster Vaccination 02/08/2020   Moderna Sars-Covid-2 Vaccination 01/25/2019, 02/22/2019   PPD Test 10/11/2020   Pneumococcal Polysaccharide-23 09/24/2017   Tdap 09/24/2017   Zoster Recombinant(Shingrix) 12/27/2021        Objective:     BP 118/78 (BP Location: Right Arm, Cuff Size: Large)   Pulse 75   Temp 97.7 F (36.5 C)   Ht 5\' 4"  (1.626 m)   Wt 226 lb 3.2 oz (102.6 kg)   SpO2 96%   BMI 38.83 kg/m   SpO2: 96 % O2 Device: None (Room air)  GENERAL: Obese woman, no acute distress, fully ambulatory, no conversational dyspnea. HEAD: Normocephalic, atraumatic.  EYES: Pupils equal, round, reactive to light.  No scleral icterus.  MOUTH: Dentition intact, oral mucosa moist.  No thrush. NECK: Supple. No thyromegaly. Trachea midline. No JVD.  No adenopathy. PULMONARY: Good air entry bilaterally.  Coarse otherwise, no adventitious sounds. CARDIOVASCULAR: S1 and S2. Regular rate and rhythm.  No rubs, murmurs or gallops heard. ABDOMEN: Obese, otherwise benign. MUSCULOSKELETAL: No joint deformity, no clubbing, no edema.  NEUROLOGIC: No overt focal deficit, no gait disturbance, speech is fluent. SKIN: Intact,warm,dry. PSYCH: Mood and behavior normal.  Representative image from CT scan performed 10 October 2022 showing right upper lobe lung nodules these are regressing from prior (arrows):    Assessment & Plan:     ICD-10-CM   1. COPD with chronic bronchitis and emphysema (HCC)  J44.89 Flu vaccine trivalent PF, 6mos and older(Flulaval,Afluria,Fluarix,Fluzone)   J43.9     2. 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 Former smoker  Z87.891     4. Need for immunization against influenza  Z23      Orders Placed This Encounter  Procedures   Flu vaccine trivalent PF, 6mos and  older(Flulaval,Afluria,Fluarix,Fluzone)   Meds ordered this encounter  Medications   albuterol (VENTOLIN HFA) 108 (90 Base) MCG/ACT inhaler    Sig: Inhale 2 puffs into the lungs every 6 (six) hours as needed for wheezing or shortness of breath.    Dispense:  6.7 g    Refill:  2   Assessment and Plan    COPD Patient reports intermittent shortness of breath. Noted inconsistent use of Stiolto. Lung sounds clear on examination. Mild emphysema noted on imaging. -Prescribe Albuterol as a rescue inhaler for use during episodes of shortness of breath..  Smoker's Bronchiolitis Patient has quit smoking. Noted improvement in lung nodules on imaging, likely due to cessation of smoking.  Lung nodules are likely secondary to this.  Lung nodules are regressing. -Continue smoking cessation.  General Health Maintenance -Administer influenza vaccine today. -Schedule follow-up lung scan for six months from October 10, 2022. -Follow-up appointment after next scan.     Gailen Shelter, MD Advanced Bronchoscopy PCCM [place] [facility]    *This note was dictated using voice recognition software/Dragon.  Despite best efforts to proofread, errors can  occur which can change the meaning. Any transcriptional errors that result from this process are unintentional and may not be fully corrected at the time of dictation.

## 2022-10-31 ENCOUNTER — Telehealth: Payer: Self-pay | Admitting: Acute Care

## 2022-10-31 ENCOUNTER — Other Ambulatory Visit: Payer: Self-pay

## 2022-10-31 DIAGNOSIS — R911 Solitary pulmonary nodule: Secondary | ICD-10-CM

## 2022-10-31 NOTE — Telephone Encounter (Signed)
I have called the patient with the results of her low dose Ct Chest. Her scan was read as a LR 3. This was a 3 month follow up after a LR 4 A scan done 03/2022. PET imaging did not show any hypermetabolism. The nodules of concern have decreased in size. Recommendation was for a 6 month follow up which will be due end of 03/2023. Pt. Is in agreement  with this plan , and understands she will get a call to get this scheduled closer to the time. Please order a 6 month follow up low dose CT Chest , and fax results to PCP, let them know there will be a follow up to check for continued stability end of March 2025. Thanks so much

## 2022-10-31 NOTE — Telephone Encounter (Signed)
Results/plan faxed to PCP and new order placed for 6 months follow up LDCT/nodule

## 2022-12-09 ENCOUNTER — Other Ambulatory Visit: Payer: Self-pay

## 2023-01-09 ENCOUNTER — Other Ambulatory Visit (HOSPITAL_COMMUNITY): Payer: Self-pay

## 2023-01-17 ENCOUNTER — Other Ambulatory Visit (HOSPITAL_BASED_OUTPATIENT_CLINIC_OR_DEPARTMENT_OTHER): Payer: Self-pay

## 2023-02-12 ENCOUNTER — Encounter: Payer: Self-pay | Admitting: Internal Medicine

## 2023-03-04 ENCOUNTER — Other Ambulatory Visit: Payer: Self-pay

## 2023-03-05 ENCOUNTER — Other Ambulatory Visit: Payer: Self-pay

## 2023-04-09 ENCOUNTER — Ambulatory Visit
Admission: RE | Admit: 2023-04-09 | Discharge: 2023-04-09 | Disposition: A | Discharge status: Home / Self Care | Source: Ambulatory Visit | Attending: Acute Care | Admitting: Acute Care

## 2023-04-09 DIAGNOSIS — R918 Other nonspecific abnormal finding of lung field: Secondary | ICD-10-CM | POA: Diagnosis not present

## 2023-04-09 DIAGNOSIS — J432 Centrilobular emphysema: Secondary | ICD-10-CM | POA: Diagnosis not present

## 2023-04-09 DIAGNOSIS — Z87891 Personal history of nicotine dependence: Secondary | ICD-10-CM | POA: Insufficient documentation

## 2023-04-09 DIAGNOSIS — I700 Atherosclerosis of aorta: Secondary | ICD-10-CM | POA: Diagnosis not present

## 2023-05-12 ENCOUNTER — Other Ambulatory Visit: Payer: Self-pay | Admitting: Acute Care

## 2023-05-12 DIAGNOSIS — Z87891 Personal history of nicotine dependence: Secondary | ICD-10-CM

## 2023-05-12 DIAGNOSIS — Z122 Encounter for screening for malignant neoplasm of respiratory organs: Secondary | ICD-10-CM

## 2023-07-29 ENCOUNTER — Other Ambulatory Visit: Payer: Self-pay | Admitting: Internal Medicine

## 2023-07-29 ENCOUNTER — Other Ambulatory Visit: Payer: Self-pay

## 2023-07-29 DIAGNOSIS — K219 Gastro-esophageal reflux disease without esophagitis: Secondary | ICD-10-CM

## 2023-07-31 ENCOUNTER — Other Ambulatory Visit: Payer: Self-pay | Admitting: Internal Medicine

## 2023-07-31 ENCOUNTER — Other Ambulatory Visit: Payer: Self-pay

## 2023-07-31 DIAGNOSIS — K219 Gastro-esophageal reflux disease without esophagitis: Secondary | ICD-10-CM

## 2023-08-01 ENCOUNTER — Other Ambulatory Visit: Payer: Self-pay

## 2023-08-01 NOTE — Telephone Encounter (Signed)
 Requested medication (s) are due for refill today - expired Rx  Requested medication (s) are on the active medication list -yes  Future visit scheduled -no  Last refill: 03/07/22 #90 3RF  Notes to clinic: Attempted to contact patient for appointment- no answer- unable to leave call back message. Expired Rx  Requested Prescriptions  Pending Prescriptions Disp Refills   omeprazole  (PRILOSEC) 20 MG capsule 90 capsule 3    Sig: TAKE 1 CAPSULE (20 MG TOTAL) BY MOUTH DAILY.     Gastroenterology: Proton Pump Inhibitors Failed - 08/01/2023  1:12 PM      Failed - Valid encounter within last 12 months    Recent Outpatient Visits   None               Requested Prescriptions  Pending Prescriptions Disp Refills   omeprazole  (PRILOSEC) 20 MG capsule 90 capsule 3    Sig: TAKE 1 CAPSULE (20 MG TOTAL) BY MOUTH DAILY.     Gastroenterology: Proton Pump Inhibitors Failed - 08/01/2023  1:12 PM      Failed - Valid encounter within last 12 months    Recent Outpatient Visits   None

## 2023-08-04 ENCOUNTER — Other Ambulatory Visit: Payer: Self-pay

## 2023-09-12 ENCOUNTER — Encounter: Payer: Self-pay | Admitting: Internal Medicine

## 2023-09-12 ENCOUNTER — Other Ambulatory Visit (HOSPITAL_COMMUNITY)
Admission: RE | Admit: 2023-09-12 | Discharge: 2023-09-12 | Disposition: A | Discharge status: Home / Self Care | Source: Ambulatory Visit | Attending: Internal Medicine | Admitting: Internal Medicine

## 2023-09-12 ENCOUNTER — Other Ambulatory Visit: Payer: Self-pay

## 2023-09-12 ENCOUNTER — Ambulatory Visit (INDEPENDENT_AMBULATORY_CARE_PROVIDER_SITE_OTHER): Admitting: Internal Medicine

## 2023-09-12 VITALS — BP 128/84 | Ht 64.0 in | Wt 225.8 lb

## 2023-09-12 DIAGNOSIS — E66812 Obesity, class 2: Secondary | ICD-10-CM

## 2023-09-12 DIAGNOSIS — Z1231 Encounter for screening mammogram for malignant neoplasm of breast: Secondary | ICD-10-CM | POA: Diagnosis not present

## 2023-09-12 DIAGNOSIS — Z0001 Encounter for general adult medical examination with abnormal findings: Secondary | ICD-10-CM | POA: Diagnosis not present

## 2023-09-12 DIAGNOSIS — Z124 Encounter for screening for malignant neoplasm of cervix: Secondary | ICD-10-CM | POA: Insufficient documentation

## 2023-09-12 DIAGNOSIS — Z6838 Body mass index (BMI) 38.0-38.9, adult: Secondary | ICD-10-CM

## 2023-09-12 DIAGNOSIS — R7303 Prediabetes: Secondary | ICD-10-CM | POA: Diagnosis not present

## 2023-09-12 DIAGNOSIS — Z23 Encounter for immunization: Secondary | ICD-10-CM

## 2023-09-12 DIAGNOSIS — E7800 Pure hypercholesterolemia, unspecified: Secondary | ICD-10-CM

## 2023-09-12 MED ORDER — OMEPRAZOLE 20 MG PO CPDR
DELAYED_RELEASE_CAPSULE | Freq: Every day | ORAL | 1 refills | Status: AC
  Filled 2023-09-12: qty 90, 90d supply, fill #0
  Filled 2023-12-16: qty 90, 90d supply, fill #1

## 2023-09-12 NOTE — Assessment & Plan Note (Signed)
 Encourage diet and exercise for weight loss

## 2023-09-12 NOTE — Progress Notes (Signed)
 Subjective:    Patient ID: Kathleen Martinez, female    DOB: July 25, 1965, 58 y.o.   MRN: [id number]  HPI  Patient presents to clinic today for her annual exam.  Flu: 10/2022 Tetanus: 09/2017 Pneumovax: 09/2017 COVID: x 4 Shingrix : 12/2021 Pap smear: 01/2018 Mammogram: 02/2022 Bone density: 02/2022 Colon screening: 06/2020 Vision screening: as needed Dentist: biannually  Diet: She does eat meat. She consumes fruits and veggies. She does eat fried foods. She drinks mostly flavored water. Exercise: None  Review of Systems     Past Medical History:  Diagnosis Date   Chronic constipation    Chronic fatigue    GERD (gastroesophageal reflux disease)    Low serum vitamin D     Menopausal state     Current Outpatient Medications  Medication Sig Dispense Refill   albuterol  (VENTOLIN  HFA) 108 (90 Base) MCG/ACT inhaler Inhale 2 puffs into the lungs every 6 (six) hours as needed for wheezing or shortness of breath. 6.7 g 2   aspirin  81 MG EC tablet Take 1 tablet (81 mg total) by mouth daily. Swallow whole. 30 tablet 12   cholecalciferol (VITAMIN D ) 1000 units tablet Take 1,000 Units by mouth daily.     clotrimazole -betamethasone  (LOTRISONE ) cream Apply 1 Application topically 2 (two) times daily. 45 g 3   mupirocin  ointment (BACTROBAN ) 2 % Apply 1 Application topically 2 (two) times daily. 22 g 3   nicotine  polacrilex (NICORETTE ) 4 MG gum Take 1 each (4 mg total) by mouth as needed for smoking cessation. (Patient not taking: Reported on 10/30/2022) 110 tablet 0   nicotine  polacrilex (SM NICOTINE  POLACRILEX) 4 MG gum USE AS DIRECTED AS NEEDED FOR SMOKING CESSATION (Patient not taking: Reported on 10/30/2022) 110 each 0   omeprazole  (PRILOSEC) 20 MG capsule TAKE 1 CAPSULE (20 MG TOTAL) BY MOUTH DAILY. 90 capsule 3   Tiotropium Bromide-Olodaterol (STIOLTO RESPIMAT ) 2.5-2.5 MCG/ACT AERS Inhale 2 puffs into the lungs daily. (Patient not taking: Reported on 10/30/2022) 8 g 0   tiZANidine  (ZANAFLEX ) 4  MG tablet Take 1 tablet (4 mg total) by mouth 2 (two) times daily as needed for muscle spasms. 30 tablet 2   Zoster Vaccine Adjuvanted (SHINGRIX ) injection Inject into the muscle. 0.5 mL 1   No current facility-administered medications for this visit.    No Known Allergies  Family History  Problem Relation Age of Onset   Diabetes Mother    Hypertension Father    Diabetes Father    Diabetes Sister    Diabetes Brother    Breast cancer Neg Hx     Social History   Socioeconomic History   Marital status: Married    Spouse name: Not on file   Number of children: Not on file   Years of education: Not on file   Highest education level: 12th grade  Occupational History   Not on file  Tobacco Use   Smoking status: Former    Current packs/day: 0.00    Average packs/day: 0.5 packs/day for 21.0 years (10.5 ttl pk-yrs)    Types: Cigarettes    Quit date: 2024    Years since quitting: 1.6   Smokeless tobacco: Never   Tobacco comments:    Quit smoking 04/2022  Vaping Use   Vaping status: Never Used  Substance and Sexual Activity   Alcohol use: Yes    Alcohol/week: 0.0 standard drinks of alcohol    Comment: occasional   Drug use: No   Sexual activity: Never  Other Topics Concern  Not on file  Social History Narrative   Not on file   Social Drivers of Health   Financial Resource Strain: Low Risk  (09/10/2023)   Overall Financial Resource Strain (CARDIA)    Difficulty of Paying Living Expenses: Not hard at all  Food Insecurity: No Food Insecurity (09/10/2023)   Hunger Vital Sign    Worried About Running Out of Food in the Last Year: Never true    Ran Out of Food in the Last Year: Never true  Transportation Needs: No Transportation Needs (09/10/2023)   PRAPARE - Administrator, Civil Service (Medical): No    Lack of Transportation (Non-Medical): No  Physical Activity: Insufficiently Active (09/10/2023)   Exercise Vital Sign    Days of Exercise per Week: 5 days     Minutes of Exercise per Session: 10 min  Stress: No Stress Concern Present (09/10/2023)   [employer] of Occupational Health - Occupational Stress Questionnaire    Feeling of Stress: Not at all  Social Connections: Moderately Integrated (09/10/2023)   Social Connection and Isolation Panel    Frequency of Communication with Friends and Family: More than three times a week    Frequency of Social Gatherings with Friends and Family: Once a week    Attends Religious Services: More than 4 times per year    Active Member of [employer] or Organizations: No    Attends Engineer, structural: Not on file    Marital Status: Married  Catering manager Violence: Not on file     Constitutional: Denies fever, malaise, fatigue, headache or abrupt weight changes.  HEENT: Denies eye pain, eye redness, ear pain, ringing in the ears, wax buildup, runny nose, nasal congestion, bloody nose, or sore throat. Respiratory: Denies difficulty breathing, shortness of breath, cough or sputum production.   Cardiovascular: Denies chest pain, chest tightness, palpitations or swelling in the hands or feet.  Gastrointestinal: Patient reports chronic constipation.  Denies abdominal pain, bloating, diarrhea or blood in the stool.  GU: Denies urgency, frequency, pain with urination, burning sensation, blood in urine, odor or discharge. Musculoskeletal: Denies decrease in range of motion, difficulty with gait, muscle pain or joint pain and swelling.  Skin: Denies redness, rashes, lesions or ulcercations.  Neurological: Denies dizziness, difficulty with memory, difficulty with speech or problems with balance and coordination.  Psych: Denies anxiety, depression, SI/HI.  No other specific complaints in a complete review of systems (except as listed in HPI above).  Objective:   Physical Exam  BP 128/84 (BP Location: Right Arm, Patient Position: Sitting, Cuff Size: Large)   Ht 5' 4 (1.626 m)   Wt 225 lb 12.8 oz (102.4  kg)   BMI 38.76 kg/m    Wt Readings from Last 3 Encounters:  10/30/22 226 lb 3.2 oz (102.6 kg)  08/05/22 220 lb 6.4 oz (100 kg)  04/16/22 214 lb (97.1 kg)    General: Appears her stated age, obese, in NAD. Skin: Warm, dry and intact. HEENT: Head: normal shape and size; Eyes: sclera white, no icterus, conjunctiva pink, PERRLA and EOMs intact;  Neck:  Neck supple, trachea midline. No masses, lumps or thyromegaly present.  Cardiovascular: Normal rate and rhythm. S1,S2 noted.  No murmur, rubs or gallops noted. No JVD. Trace BLE edema. No carotid bruits noted. Pulmonary/Chest: Normal effort and positive vesicular breath sounds. No respiratory distress. No wheezes, rales or ronchi noted.  Abdomen: Soft and nontender. Normal bowel sounds.  Pelvic: Normal female anatomy.  Cervix not well-visualized.  Unable to feel cervix with bimanual exam.  Adnexa nonpalpable. Musculoskeletal: Strength 5/5 BUE/BLE. No difficulty with gait.  Neurological: Alert and oriented. Cranial nerves II-XII grossly intact. Coordination normal.  Psychiatric: Mood and affect normal. Behavior is normal. Judgment and thought content normal.    BMET    Component Value Date/Time   NA 142 12/25/2021 0844   K 5.3 12/25/2021 0844   CL 108 12/25/2021 0844   CO2 26 12/25/2021 0844   GLUCOSE 101 (H) 12/25/2021 0844   BUN 16 12/25/2021 0844   CREATININE 1.10 (H) 12/25/2021 0844   CALCIUM 9.5 12/25/2021 0844   GFRNONAA 49 (L) 03/27/2017 0032   GFRAA 57 (L) 03/27/2017 0032    Lipid Panel     Component Value Date/Time   CHOL 207 (H) 12/25/2021 0844   TRIG 102 12/25/2021 0844   HDL 45 (L) 12/25/2021 0844   CHOLHDL 4.6 12/25/2021 0844   LDLCALC 140 (H) 12/25/2021 0844    CBC    Component Value Date/Time   WBC 6.0 12/25/2021 0844   RBC 5.23 (H) 12/25/2021 0844   HGB 14.0 12/25/2021 0844   HCT 43.5 12/25/2021 0844   PLT 249 12/25/2021 0844   MCV 83.2 12/25/2021 0844   MCH 26.8 (L) 12/25/2021 0844   MCHC 32.2  12/25/2021 0844   RDW 14.6 12/25/2021 0844   LYMPHSABS 3.0 03/27/2017 0032   MONOABS 0.4 03/27/2017 0032   EOSABS 0.2 03/27/2017 0032   BASOSABS 0.0 03/27/2017 0032    Hgb A1C Lab Results  Component Value Date   HGBA1C 6.2 (H) 12/25/2021            Assessment & Plan:   Preventative Health Maintenance:  Encouraged her to get a flu shot in the fall Tetanus UTD Encouraged her to get her COVID booster Prevnar 20 today Encouraged her to get her second Shingrix  vaccine Pap smear today, she declines STD screening Mammogram she will call to schedule Bone density UTD Low-dose CT lung cancer screening declined as she no longer smokes Colon screening UTD Encouraged her to consume a balanced diet and exercise regimen Advised her to see an eye doctor and dentist annually We will check CBC, c-Met, lipid, A1c today  RTC in 6 months, follow-up chronic conditions Angeline Laura, NP

## 2023-09-12 NOTE — Patient Instructions (Signed)
 Health Maintenance for Postmenopausal Women Menopause is a normal process in which your ability to get pregnant comes to an end. This process happens slowly over many months or years, usually between the ages of 76 and 38. Menopause is complete when you have missed your menstrual period for 12 months. It is important to talk with your health care provider about some of the most common conditions that affect women after menopause (postmenopausal women). These include heart disease, cancer, and bone loss (osteoporosis). Adopting a healthy lifestyle and getting preventive care can help to promote your health and wellness. The actions you take can also lower your chances of developing some of these common conditions. What are the signs and symptoms of menopause? During menopause, you may have the following symptoms: Hot flashes. These can be moderate or severe. Night sweats. Decrease in sex drive. Mood swings. Headaches. Tiredness (fatigue). Irritability. Memory problems. Problems falling asleep or staying asleep. Talk with your health care provider about treatment options for your symptoms. Do I need hormone replacement therapy? Hormone replacement therapy is effective in treating symptoms that are caused by menopause, such as hot flashes and night sweats. Hormone replacement carries certain risks, especially as you become older. If you are thinking about using estrogen or estrogen with progestin, discuss the benefits and risks with your health care provider. How can I reduce my risk for heart disease and stroke? The risk of heart disease, heart attack, and stroke increases as you age. One of the causes may be a change in the body's hormones during menopause. This can affect how your body uses dietary fats, triglycerides, and cholesterol. Heart attack and stroke are medical emergencies. There are many things that you can do to help prevent heart disease and stroke. Watch your blood pressure High  blood pressure causes heart disease and increases the risk of stroke. This is more likely to develop in people who have high blood pressure readings or are overweight. Have your blood pressure checked: Every 3-5 years if you are 32-23 years of age. Every year if you are 31 years old or older. Eat a healthy diet  Eat a diet that includes plenty of vegetables, fruits, low-fat dairy products, and lean protein. Do not eat a lot of foods that are high in solid fats, added sugars, or sodium. Get regular exercise Get regular exercise. This is one of the most important things you can do for your health. Most adults should: Try to exercise for at least 150 minutes each week. The exercise should increase your heart rate and make you sweat (moderate-intensity exercise). Try to do strengthening exercises at least twice each week. Do these in addition to the moderate-intensity exercise. Spend less time sitting. Even light physical activity can be beneficial. Other tips Work with your health care provider to achieve or maintain a healthy weight. Do not use any products that contain nicotine or tobacco. These products include cigarettes, chewing tobacco, and vaping devices, such as e-cigarettes. If you need help quitting, ask your health care provider. Know your numbers. Ask your health care provider to check your cholesterol and your blood sugar (glucose). Continue to have your blood tested as directed by your health care provider. Do I need screening for cancer? Depending on your health history and family history, you may need to have cancer screenings at different stages of your life. This may include screening for: Breast cancer. Cervical cancer. Lung cancer. Colorectal cancer. What is my risk for osteoporosis? After menopause, you may be  at increased risk for osteoporosis. Osteoporosis is a condition in which bone destruction happens more quickly than new bone creation. To help prevent osteoporosis or  the bone fractures that can happen because of osteoporosis, you may take the following actions: If you are 24-54 years old, get at least 1,000 mg of calcium and at least 600 international units (IU) of vitamin D  per day. If you are older than age 75 but younger than age 30, get at least 1,200 mg of calcium and at least 600 international units (IU) of vitamin D  per day. If you are older than age 8, get at least 1,200 mg of calcium and at least 800 international units (IU) of vitamin D  per day. Smoking and drinking excessive alcohol increase the risk of osteoporosis. Eat foods that are rich in calcium and vitamin D , and do weight-bearing exercises several times each week as directed by your health care provider. How does menopause affect my mental health? Depression may occur at any age, but it is more common as you become older. Common symptoms of depression include: Feeling depressed. Changes in sleep patterns. Changes in appetite or eating patterns. Feeling an overall lack of motivation or enjoyment of activities that you previously enjoyed. Frequent crying spells. Talk with your health care provider if you think that you are experiencing any of these symptoms. General instructions See your health care provider for regular wellness exams and vaccines. This may include: Scheduling regular health, dental, and eye exams. Getting and maintaining your vaccines. These include: Influenza vaccine. Get this vaccine each year before the flu season begins. Pneumonia vaccine. Shingles vaccine. Tetanus, diphtheria, and pertussis (Tdap) booster vaccine. Your health care provider may also recommend other immunizations. Tell your health care provider if you have ever been abused or do not feel safe at home. Summary Menopause is a normal process in which your ability to get pregnant comes to an end. This condition causes hot flashes, night sweats, decreased interest in sex, mood swings, headaches, or lack  of sleep. Treatment for this condition may include hormone replacement therapy. Take actions to keep yourself healthy, including exercising regularly, eating a healthy diet, watching your weight, and checking your blood pressure and blood sugar levels. Get screened for cancer and depression. Make sure that you are up to date with all your vaccines. This information is not intended to replace advice given to you by your health care provider. Make sure you discuss any questions you have with your health care provider. Document Revised: 05/22/2020 Document Reviewed: 05/22/2020 Elsevier Patient Education  2024 [employer].

## 2023-09-13 LAB — COMPREHENSIVE METABOLIC PANEL WITH GFR
AG Ratio: 1.7 (calc) (ref 1.0–2.5)
ALT: 14 U/L (ref 6–29)
AST: 26 U/L (ref 10–35)
Albumin: 4.4 g/dL (ref 3.6–5.1)
Alkaline phosphatase (APISO): 59 U/L (ref 37–153)
BUN/Creatinine Ratio: 13 (calc) (ref 6–22)
BUN: 15 mg/dL (ref 7–25)
CO2: 27 mmol/L (ref 20–32)
Calcium: 9.3 mg/dL (ref 8.6–10.4)
Chloride: 106 mmol/L (ref 98–110)
Creat: 1.16 mg/dL — ABNORMAL HIGH (ref 0.50–1.03)
Globulin: 2.6 g/dL (ref 1.9–3.7)
Glucose, Bld: 105 mg/dL — ABNORMAL HIGH (ref 65–99)
Potassium: 4.6 mmol/L (ref 3.5–5.3)
Sodium: 140 mmol/L (ref 135–146)
Total Bilirubin: 0.5 mg/dL (ref 0.2–1.2)
Total Protein: 7 g/dL (ref 6.1–8.1)
eGFR: 55 mL/min/1.73m2 — ABNORMAL LOW (ref >=60)

## 2023-09-13 LAB — CBC
HCT: 41.6 % (ref 35.0–45.0)
Hemoglobin: 13.2 g/dL (ref 11.7–15.5)
MCH: 26.3 pg — ABNORMAL LOW (ref 27.0–33.0)
MCHC: 31.7 g/dL — ABNORMAL LOW (ref 32.0–36.0)
MCV: 83 fL (ref 80.0–100.0)
MPV: 11.6 fL (ref 7.5–12.5)
Platelets: 246 Thousand/uL (ref 140–400)
RBC: 5.01 Million/uL (ref 3.80–5.10)
RDW: 14.1 % (ref 11.0–15.0)
WBC: 5.6 Thousand/uL (ref 3.8–10.8)

## 2023-09-13 LAB — LIPID PANEL
Cholesterol: 212 mg/dL — ABNORMAL HIGH (ref <200)
HDL: 43 mg/dL — ABNORMAL LOW (ref >=50)
LDL Cholesterol (Calc): 151 mg/dL — ABNORMAL HIGH
Non-HDL Cholesterol (Calc): 169 mg/dL — ABNORMAL HIGH (ref <130)
Total CHOL/HDL Ratio: 4.9 (calc) (ref <5.0)
Triglycerides: 81 mg/dL (ref <150)

## 2023-09-13 LAB — HEMOGLOBIN A1C
Hgb A1c MFr Bld: 6.5 % — ABNORMAL HIGH (ref <5.7)
Mean Plasma Glucose: 140 mg/dL
eAG (mmol/L): 7.7 mmol/L

## 2023-09-14 ENCOUNTER — Ambulatory Visit: Payer: Self-pay | Admitting: Internal Medicine

## 2023-09-16 LAB — CYTOLOGY - PAP
Adequacy: ABSENT
Comment: NEGATIVE
Diagnosis: NEGATIVE
High risk HPV: NEGATIVE

## 2023-09-17 ENCOUNTER — Ambulatory Visit: Admitting: Internal Medicine

## 2023-09-17 ENCOUNTER — Encounter: Payer: Self-pay | Admitting: Internal Medicine

## 2023-09-17 ENCOUNTER — Other Ambulatory Visit: Payer: Self-pay

## 2023-09-17 VITALS — BP 124/82 | Ht 64.0 in | Wt 225.6 lb

## 2023-09-17 DIAGNOSIS — E1169 Type 2 diabetes mellitus with other specified complication: Secondary | ICD-10-CM

## 2023-09-17 DIAGNOSIS — E785 Hyperlipidemia, unspecified: Secondary | ICD-10-CM

## 2023-09-17 DIAGNOSIS — E1165 Type 2 diabetes mellitus with hyperglycemia: Secondary | ICD-10-CM | POA: Diagnosis not present

## 2023-09-17 MED ORDER — ATORVASTATIN CALCIUM 20 MG PO TABS
20.0000 mg | ORAL_TABLET | Freq: Every day | ORAL | 1 refills | Status: AC
  Filled 2023-09-17: qty 90, 90d supply, fill #0
  Filled 2023-12-16: qty 90, 90d supply, fill #1

## 2023-09-17 NOTE — Progress Notes (Signed)
 Subjective:    Patient ID: Kathleen Martinez, female    DOB: [birth year], 58 y.o.   MRN: [id number]  HPI  Discussed the use of AI scribe software for clinical note transcription with the patient, who gave verbal consent to proceed.  Kathleen Martinez is a 58 year old female with chronic kidney disease who presents for follow-up on her lab results indicating diabetes.  She has an A1c of 6.5. She has a family history of diabetes on both sides. She experiences difficulty losing weight despite efforts and is not currently on any diabetes medication. She is unfamiliar with dietary recommendations for diabetes management and is interested in seeing a diabetes educator and nutritionist.  She has chronic kidney disease, which may influence her diabetes treatment options. Her current diet consists of coffee for breakfast and typically one meal a day, which may include chicken or a hamburger.  She is currently taking aspirin . Her LDL level is 151. Her HDL is 43. She does not have an eye doctor.      Review of Systems     Past Medical History:  Diagnosis Date   Chronic constipation    Chronic fatigue    GERD (gastroesophageal reflux disease)    Low serum vitamin D     Menopausal state     Current Outpatient Medications  Medication Sig Dispense Refill   albuterol  (VENTOLIN  HFA) 108 (90 Base) MCG/ACT inhaler Inhale 2 puffs into the lungs every 6 (six) hours as needed for wheezing or shortness of breath. 6.7 g 2   aspirin  81 MG EC tablet Take 1 tablet (81 mg total) by mouth daily. Swallow whole. 30 tablet 12   cholecalciferol (VITAMIN D ) 1000 units tablet Take 1,000 Units by mouth daily.     omeprazole  (PRILOSEC) 20 MG capsule TAKE 1 CAPSULE (20 MG TOTAL) BY MOUTH DAILY. 90 capsule 1   No current facility-administered medications for this visit.    No Known Allergies  Family History  Problem Relation Age of Onset   Diabetes Mother    Hypertension Father    Diabetes Father    Diabetes Sister     Diabetes Brother    Breast cancer Neg Hx     Social History   Socioeconomic History   Marital status: Married    Spouse name: Not on file   Number of children: Not on file   Years of education: Not on file   Highest education level: 12th grade  Occupational History   Not on file  Tobacco Use   Smoking status: Former    Current packs/day: 0.00    Average packs/day: 0.5 packs/day for 21.0 years (10.5 ttl pk-yrs)    Types: Cigarettes    Quit date: 2024    Years since quitting: 1.6   Smokeless tobacco: Never   Tobacco comments:    Quit smoking 04/2022  Vaping Use   Vaping status: Never Used  Substance and Sexual Activity   Alcohol use: Yes    Comment: occasional   Drug use: No   Sexual activity: Never  Other Topics Concern   Not on file  Social History Narrative   Not on file   Social Drivers of Health   Financial Resource Strain: Low Risk  (09/10/2023)   Overall Financial Resource Strain (CARDIA)    Difficulty of Paying Living Expenses: Not hard at all  Food Insecurity: No Food Insecurity (09/10/2023)   Hunger Vital Sign    Worried About Running Out of Food in the Last Year:  Never true    Ran Out of Food in the Last Year: Never true  Transportation Needs: No Transportation Needs (09/10/2023)   PRAPARE - Administrator, Civil Service (Medical): No    Lack of Transportation (Non-Medical): No  Physical Activity: Insufficiently Active (09/10/2023)   Exercise Vital Sign    Days of Exercise per Week: 5 days    Minutes of Exercise per Session: 10 min  Stress: No Stress Concern Present (09/10/2023)   [employer] of Occupational Health - Occupational Stress Questionnaire    Feeling of Stress: Not at all  Social Connections: Moderately Integrated (09/10/2023)   Social Connection and Isolation Panel    Frequency of Communication with Friends and Family: More than three times a week    Frequency of Social Gatherings with Friends and Family: Once a week     Attends Religious Services: More than 4 times per year    Active Member of [employer] or Organizations: No    Attends Engineer, structural: Not on file    Marital Status: Married  Catering manager Violence: Not on file     Constitutional: Denies fever, malaise, fatigue, headache or abrupt weight changes.  HEENT: Denies eye pain, eye redness, ear pain, ringing in the ears, wax buildup, runny nose, nasal congestion, bloody nose, or sore throat. Respiratory: Denies difficulty breathing, shortness of breath, cough or sputum production.   Cardiovascular: Denies chest pain, chest tightness, palpitations or swelling in the hands or feet.  Gastrointestinal: Patient reports chronic constipation.  Denies abdominal pain, bloating, diarrhea or blood in the stool.  GU: Denies urgency, frequency, pain with urination, burning sensation, blood in urine, odor or discharge. Musculoskeletal: Denies decrease in range of motion, difficulty with gait, muscle pain or joint pain and swelling.  Skin: Denies redness, rashes, lesions or ulcercations.  Neurological: Denies dizziness, difficulty with memory, difficulty with speech or problems with balance and coordination.  Psych: Denies anxiety, depression, SI/HI.  No other specific complaints in a complete review of systems (except as listed in HPI above).  Objective:   Physical Exam  BP 124/82 (BP Location: Left Arm, Patient Position: Sitting, Cuff Size: Large)   Ht 5' 4 (1.626 m)   Wt 225 lb 9.6 oz (102.3 kg)   BMI 38.72 kg/m     Wt Readings from Last 3 Encounters:  09/12/23 225 lb 12.8 oz (102.4 kg)  10/30/22 226 lb 3.2 oz (102.6 kg)  08/05/22 220 lb 6.4 oz (100 kg)    General: Appears her stated age, obese, in NAD. Skin: Warm, dry and intact.  No ulcerations noted. Cardiovascular: Normal rate.. Pulmonary/Chest: Normal effort. No respiratory distress.  Musculoskeletal:  No difficulty with gait.  Neurological: Alert and oriented.   BMET     Component Value Date/Time   NA 140 09/12/2023 0843   K 4.6 09/12/2023 0843   CL 106 09/12/2023 0843   CO2 27 09/12/2023 0843   GLUCOSE 105 (H) 09/12/2023 0843   BUN 15 09/12/2023 0843   CREATININE 1.16 (H) 09/12/2023 0843   CALCIUM  9.3 09/12/2023 0843   GFRNONAA 49 (L) 03/27/2017 0032   GFRAA 57 (L) 03/27/2017 0032    Lipid Panel     Component Value Date/Time   CHOL 212 (H) 09/12/2023 0843   TRIG 81 09/12/2023 0843   HDL 43 (L) 09/12/2023 0843   CHOLHDL 4.9 09/12/2023 0843   LDLCALC 151 (H) 09/12/2023 0843    CBC    Component Value Date/Time   WBC  5.6 09/12/2023 0843   RBC 5.01 09/12/2023 0843   HGB 13.2 09/12/2023 0843   HCT 41.6 09/12/2023 0843   PLT 246 09/12/2023 0843   MCV 83.0 09/12/2023 0843   MCH 26.3 (L) 09/12/2023 0843   MCHC 31.7 (L) 09/12/2023 0843   RDW 14.1 09/12/2023 0843   LYMPHSABS 3.0 03/27/2017 0032   MONOABS 0.4 03/27/2017 0032   EOSABS 0.2 03/27/2017 0032   BASOSABS 0.0 03/27/2017 0032    Hgb A1C Lab Results  Component Value Date   HGBA1C 6.5 (H) 09/12/2023            Assessment & Plan:   Assessment and Plan    Type 2 diabetes mellitus New diagnosis with A1c of 6.5%, likely related to diet and weight. Discussed potential complications: retinopathy, neuropathy, cardiovascular risk. - Refer to diabetes education and nutritionist in [place]. - Refer to ophthalmologist in [place] for annual diabetic eye exam. - Order A1c in three months. - Provide dietary guidance: avoid bread, pasta, potatoes, rice, and certain starchy vegetables; eat regular meals with protein and non-starchy vegetables. - Discussed low-dose metformin for weight loss benefits, with potential GI side effects, or recheck A1c in three months to decide on medication initiation.  Chronic kidney disease Considered diabetes management with respect to kidney function. Metformin chosen for potential weight loss benefits and low dose to minimize side effects. - Avoid  NSAIDs OTC  Hyperlipidemia Elevated LDL at 151 mg/dL and low HDL at 43 mg/dL, increasing cardiovascular risk. -Discussed LDL goal less than 70 - Prescribe atorvastatin  20 mg medication to be taken once daily at bedtime. - Continue aspirin  81 mg daily. - Order c-Met and lipid panel in three months.        RTC in 5 months, follow-up chronic conditions Angeline Laura, NP

## 2023-09-17 NOTE — Patient Instructions (Signed)
 Diabetes: Types of Medical Care to Help You Manage Living with and managing diabetes, also known as diabetes mellitus, can be a challenge. Your diabetes care may involve a team of health care providers to help you manage. This team may include: An endocrinologist. This is a physician who specializes in diabetes. Nurses. An expert in healthy eating called a dietitian. A diabetes care and education specialist. An eye doctor. A foot specialist called a podiatrist. How to manage your diabetes Managing your diabetes involves a lot of steps to keep you healthy. Your team will follow guidelines to help you get the best care possible. Here are some general guidelines for managing your diabetes: Physical exams When you're first diagnosed with diabetes, and each year after that, your provider will ask about your medical and family history. You'll also have a physical exam. It may include: Measuring your height, weight, and body mass index (BMI). Checking your blood pressure. Your target blood pressure may vary based on things like your age and health conditions. A thyroid exam. A skin exam. Screening for nerve damage. This may include checking your hands, feet, and arms for: Pain. Tingling or numbness. Weakness. An exam to check your feet. They'll be checked for cuts, bruises, redness, blisters, sores, or other problems. Screening for blood vessel problems. This may include checking the pulse and temperature in your legs and feet. Blood tests Depending on your treatment plan, you may have these tests: Hemoglobin A1C (HbA1C). This test gives information about your blood sugar levels, also called glucose levels, over the past 2-3 months. It's used to adjust your treatment plan, if needed. Lipid testing. This includes total cholesterol, LDL and HDL cholesterol, and triglyceride levels. The goal for LDL is less than 100 mg/dL (5.5 mmol/L). If you're at high risk for problems, the goal is less than 70  mg/dL (3.9 mmol/L). The goal for HDL is 40 mg/dL (2.2 mmol/L) or higher for males, and 50 mg/dL (2.8 mmol/L) or higher for females. The goal for triglycerides is less than 150 mg/dL (8.3 mmol/L). Liver function tests. Kidney function tests. Thyroid function tests.  Dental and eye exams  Visit your dentist two times a year for checkups. Get eye exams as recommended by your provider. This may include: If you have type 1 diabetes, get an eye exam within 5 years after you're diagnosed. Then get exams once a year after your first exam. Children with type 1 diabetes should get an eye exam when they're 68 years old or older and they've had diabetes for 3-5 years. After the first exam, they should get an eye exam every 2 years. If you have type 2 diabetes, get an eye exam as soon as you're diagnosed. Then get one every 1-2 years after your first exam. Shots or vaccines Get shots or vaccines as told. Guidelines include: Everyone aged 26 months and older should get a flu shot every year. People at least 59 years old who have diabetes should get the pneumonia vaccine. All adults who get diagnosed with diabetes should get the hepatitis B vaccine. For all other vaccines, follow the recommendations from the Centers for Disease Control and Prevention (CDC) based on your age. Mental and emotional health Screening for eating disorders, anxiety, and depression is suggested at the time of diagnosis and then as needed. If you show symptoms, you may need more evaluation. You may need to work with a mental health provider. Follow these instructions at home: Treatment plan You'll monitor your blood sugar levels  and may give yourself insulin. Your treatment plan will be reviewed at every medical visit. You and your provider will discuss: How you're taking your medicines, including insulin. Any side effects you have. Your target goals for your blood sugar level. How often you check your blood sugar  level. Lifestyle habits, such as: Your activity level. Any use of tobacco, alcohol, or other substances. Education Your provider will assess how well you manage your blood sugar levels and medicines. You may be referred to: A certified diabetes care and education specialist. This person can help you manage your diabetes throughout your life. A dietitian who can help with your eating plan. An exercise specialist who can discuss your activity level and exercise plan. General instructions Take medicines only as told. Where to find more information American Diabetes Association (ADA): diabetes.org Association of Diabetes Care & Education Specialists (ADCES): adces.org/diabetes-education-dsmes International Diabetes Federation (IDF): http://hill.biz/ This information is not intended to replace advice given to you by your health care provider. Make sure you discuss any questions you have with your health care provider. Document Revised: 08/09/2022 Document Reviewed: 08/09/2022 Elsevier Patient Education  2024 [employer].

## 2023-09-23 ENCOUNTER — Inpatient Hospital Stay
Admission: RE | Admit: 2023-09-23 | Discharge: 2023-09-23 | Discharge status: Home / Self Care | Source: Ambulatory Visit | Attending: Internal Medicine | Admitting: Internal Medicine

## 2023-09-23 DIAGNOSIS — Z0001 Encounter for general adult medical examination with abnormal findings: Secondary | ICD-10-CM | POA: Diagnosis not present

## 2023-09-23 DIAGNOSIS — Z1231 Encounter for screening mammogram for malignant neoplasm of breast: Secondary | ICD-10-CM | POA: Insufficient documentation

## 2023-09-29 ENCOUNTER — Other Ambulatory Visit: Payer: Self-pay

## 2023-09-29 MED ORDER — TRAMADOL HCL 50 MG PO TABS
50.0000 mg | ORAL_TABLET | ORAL | 0 refills | Status: AC | PRN
  Filled 2023-09-29: qty 7, 1d supply, fill #0

## 2023-09-29 MED ORDER — CHLORHEXIDINE GLUCONATE 0.12 % MT SOLN
5.0000 mL | Freq: Two times a day (BID) | OROMUCOSAL | 1 refills | Status: AC
  Filled 2023-09-29: qty 473, 24d supply, fill #0

## 2023-10-01 DIAGNOSIS — H0288A Meibomian gland dysfunction right eye, upper and lower eyelids: Secondary | ICD-10-CM | POA: Diagnosis not present

## 2023-10-01 DIAGNOSIS — H2513 Age-related nuclear cataract, bilateral: Secondary | ICD-10-CM | POA: Diagnosis not present

## 2023-10-01 DIAGNOSIS — E119 Type 2 diabetes mellitus without complications: Secondary | ICD-10-CM | POA: Diagnosis not present

## 2023-10-01 LAB — HM DIABETES EYE EXAM

## 2023-10-22 ENCOUNTER — Encounter: Payer: Self-pay | Admitting: Dietician

## 2023-10-22 ENCOUNTER — Encounter: Attending: Internal Medicine | Admitting: Dietician

## 2023-10-22 DIAGNOSIS — E1165 Type 2 diabetes mellitus with hyperglycemia: Secondary | ICD-10-CM | POA: Diagnosis not present

## 2023-10-22 DIAGNOSIS — E785 Hyperlipidemia, unspecified: Secondary | ICD-10-CM | POA: Diagnosis not present

## 2023-10-22 DIAGNOSIS — E1169 Type 2 diabetes mellitus with other specified complication: Secondary | ICD-10-CM | POA: Insufficient documentation

## 2023-10-22 DIAGNOSIS — Z713 Dietary counseling and surveillance: Secondary | ICD-10-CM | POA: Insufficient documentation

## 2023-10-22 NOTE — Progress Notes (Signed)
 Diabetes Self-Management Education  Visit Type: First/Initial  Appt. Start Time: 0850 Appt. End Time: 1000  10/22/2023  [name], identified by name and date of birth, is a 58 y.o. female with a diagnosis of Diabetes: Type 2.   ASSESSMENT Pt reports wanting to avoid medications, reports history of preDM over last 3 years. 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 Pt states that they usually don't eat breakfast usually, may eat cafeteria foods at work for breakfast, will usually have lunch meals from school. Pt states they may bring lunch to work occasionally, usually a salad or soup.    Diabetes Self-Management Education - 10/21/23 0012       Visit Information   Visit Type First/Initial      Initial Visit   Diabetes Type Type 2      Psychosocial Assessment   Patient Belief/Attitude about Diabetes Motivated to manage diabetes    What is the hardest part about your diabetes right now, causing you the most concern, or is the most worrisome to you about your diabetes?   Taking/obtaining medications;Making healty food and beverage choices   Wants to avoid medications   Self-care barriers None    Self-management support Doctor's office    Other persons present Patient    Patient Concerns Medication;Nutrition/Meal planning    Special Needs None    Preferred Learning Style Visual;Hands on    Learning Readiness Ready    How often do you need to have someone help you when you read instructions, pamphlets, or other written materials from your doctor or pharmacy? 1 - Never      Pre-Education Assessment   Patient understands the diabetes disease and treatment process. Needs Instruction    Patient understands incorporating nutritional management into lifestyle. Needs Instruction    Patient undertands incorporating physical activity into lifestyle. Needs  Instruction    Patient understands using medications safely. Needs Instruction    Patient understands monitoring blood glucose, interpreting and using results Needs Instruction    Patient understands prevention, detection, and treatment of acute complications. Needs Instruction    Patient understands prevention, detection, and treatment of chronic complications. Needs Instruction    Patient understands how to develop strategies to address psychosocial issues. Needs Instruction    Patient understands how to develop strategies to promote health/change behavior. Needs Instruction      Complications   Last HgB A1C per patient/outside source 6.5 %   09/12/2023   How often do you check your blood sugar? 0 times/day (not testing)    Are you checking your feet? Yes    How many days per week are you checking your feet? 4      Dietary Intake   Snack (morning) 2-3 handfuls of Lays Potato chips, 2 cups of coffee w/ chocolate flavored creamer    Lunch Chicken, rice, and broccoli, Sweet tea, water    Dinner Meatloaf (onions, eggs, bread crumbs, ground beef), Water    Beverage(s) Water, Sweet tea, coffee      Activity / Exercise   Activity / Exercise Type ADL's      Patient Education   Disease Pathophysiology Factors that contribute to the development of diabetes;Explored patient's options for treatment of their diabetes    Healthy Eating Role of diet in the treatment of diabetes and the relationship between the three main macronutrients and blood glucose  level;Plate Method    Being Active Helped patient identify appropriate exercises in relation to his/her diabetes, diabetes complications and other health issue.    Chronic complications Relationship between chronic complications and blood glucose control    Diabetes Stress and Support Identified and addressed patients feelings and concerns about diabetes    Lifestyle and Health Coping Lifestyle issues that need to be addressed for better diabetes care       Individualized Goals (developed by patient)   Nutrition Follow meal plan discussed    Physical Activity Exercise 1-2 times per week    Medications Not Applicable    Monitoring  Not Applicable    Problem Solving Eating Pattern      Post-Education Assessment   Patient understands the diabetes disease and treatment process. Needs Review    Patient understands incorporating nutritional management into lifestyle. Needs Review    Patient undertands incorporating physical activity into lifestyle. Needs Review    Patient understands using medications safely. N/A    Patient understands monitoring blood glucose, interpreting and using results N/A    Patient understands prevention, detection, and treatment of acute complications. Needs Review    Patient understands prevention, detection, and treatment of chronic complications. Needs Review    Patient understands how to develop strategies to address psychosocial issues. Needs Review    Patient understands how to develop strategies to promote health/change behavior. Needs Review      Outcomes   Expected Outcomes Demonstrated interest in learning. Expect positive outcomes    Future DMSE 2 months    Program Status Not Completed          Individualized Plan for Diabetes Self-Management Training:   Learning Objective:  Patient will have a greater understanding of diabetes self-management. Patient education plan is to attend individual and/or group sessions per assessed needs and concerns.   Plan:   Patient Instructions  Work towards eating three meals a day, about 5-6 hours apart! Start your day with a couple small bites of food to get your body/appetite used to having nutrition in the morning!  Begin to recognize carbohydrates, proteins, and non-starchy vegetables in your food choices!  Begin to build your meals using the proportions of the Balanced Plate. First, select your carb choice(s) for the meal. Make this 25% of your  meal. Next, select your source of protein to pair with your carb choice(s). Make this another 25% of your meal. Finally, complete your meal with a variety of non-starchy vegetables. Make this the remaining 50% of your meal.  When snacking on nuts, seeds, chips, or fruits, stick to a serving size that is a palm full.   Try having unsweet tea and adding packets of Splenda or Stevia, and diet/Zero sugar ginger ale  Expected Outcomes:  Demonstrated interest in learning. Expect positive outcomes  Education material provided: My Plate  If problems or questions, patient to contact team via:  Phone and Email  Future DSME appointment: 2 months

## 2023-10-22 NOTE — Patient Instructions (Addendum)
 Work towards eating three meals a day, about 5-6 hours apart! Start your day with a couple small bites of food to get your body/appetite used to having nutrition in the morning!  Begin to recognize carbohydrates, proteins, and non-starchy vegetables in your food choices!  Begin to build your meals using the proportions of the Balanced Plate. First, select your carb choice(s) for the meal. Make this 25% of your meal. Next, select your source of protein to pair with your carb choice(s). Make this another 25% of your meal. Finally, complete your meal with a variety of non-starchy vegetables. Make this the remaining 50% of your meal.  When snacking on nuts, seeds, chips, or fruits, stick to a serving size that is a palm full.   Try having unsweet tea and adding packets of Splenda or Stevia, and diet/Zero sugar ginger ale

## 2023-12-16 ENCOUNTER — Other Ambulatory Visit: Payer: Self-pay

## 2023-12-19 ENCOUNTER — Other Ambulatory Visit

## 2023-12-19 DIAGNOSIS — E1169 Type 2 diabetes mellitus with other specified complication: Secondary | ICD-10-CM

## 2023-12-19 DIAGNOSIS — E1165 Type 2 diabetes mellitus with hyperglycemia: Secondary | ICD-10-CM

## 2023-12-19 LAB — LIPID PANEL
Cholesterol: 127 mg/dL (ref <200)
HDL: 40 mg/dL — ABNORMAL LOW (ref >=50)
LDL Cholesterol (Calc): 73 mg/dL
Non-HDL Cholesterol (Calc): 87 mg/dL (ref <130)
Total CHOL/HDL Ratio: 3.2 (calc) (ref <5.0)
Triglycerides: 59 mg/dL (ref <150)

## 2023-12-19 LAB — COMPREHENSIVE METABOLIC PANEL WITH GFR
AG Ratio: 1.6 (calc) (ref 1.0–2.5)
ALT: 19 U/L (ref 6–29)
AST: 22 U/L (ref 10–35)
Albumin: 4.2 g/dL (ref 3.6–5.1)
Alkaline phosphatase (APISO): 69 U/L (ref 37–153)
BUN/Creatinine Ratio: 11 (calc) (ref 6–22)
BUN: 14 mg/dL (ref 7–25)
CO2: 26 mmol/L (ref 20–32)
Calcium: 9.3 mg/dL (ref 8.6–10.4)
Chloride: 104 mmol/L (ref 98–110)
Creat: 1.3 mg/dL — ABNORMAL HIGH (ref 0.50–1.03)
Globulin: 2.7 g/dL (ref 1.9–3.7)
Glucose, Bld: 133 mg/dL — ABNORMAL HIGH (ref 65–99)
Potassium: 4.4 mmol/L (ref 3.5–5.3)
Sodium: 140 mmol/L (ref 135–146)
Total Bilirubin: 0.6 mg/dL (ref 0.2–1.2)
Total Protein: 6.9 g/dL (ref 6.1–8.1)
eGFR: 47 mL/min/1.73m2 — ABNORMAL LOW (ref >=60)

## 2023-12-19 LAB — HEMOGLOBIN A1C
Hgb A1c MFr Bld: 6.4 % — ABNORMAL HIGH (ref <5.7)
Mean Plasma Glucose: 137 mg/dL
eAG (mmol/L): 7.6 mmol/L

## 2023-12-22 ENCOUNTER — Ambulatory Visit: Payer: Self-pay | Admitting: Internal Medicine

## 2024-01-02 ENCOUNTER — Ambulatory Visit: Admitting: Dietician

## 2024-03-10 ENCOUNTER — Ambulatory Visit: Admitting: Internal Medicine
